# Patient Record
Sex: Female | Born: 1975 | Race: Black or African American | Hispanic: No | Marital: Single | Smoking: Never smoker
Health system: Southern US, Community
[De-identification: ages and names within clinical notes are randomized; demographics above are authoritative.]

## PROBLEM LIST (undated history)

## (undated) DIAGNOSIS — J45909 Unspecified asthma, uncomplicated: Secondary | ICD-10-CM

## (undated) DIAGNOSIS — I10 Essential (primary) hypertension: Secondary | ICD-10-CM

## (undated) DIAGNOSIS — R569 Unspecified convulsions: Secondary | ICD-10-CM

## (undated) HISTORY — PX: TUBAL LIGATION: SHX77

---

## 2009-02-20 ENCOUNTER — Emergency Department (HOSPITAL_BASED_OUTPATIENT_CLINIC_OR_DEPARTMENT_OTHER): Admission: EM | Admit: 2009-02-20 | Discharge: 2009-02-20 | Payer: Self-pay | Admitting: Emergency Medicine

## 2010-10-14 ENCOUNTER — Emergency Department (HOSPITAL_BASED_OUTPATIENT_CLINIC_OR_DEPARTMENT_OTHER)
Admission: EM | Admit: 2010-10-14 | Discharge: 2010-10-14 | Payer: Self-pay | Source: Home / Self Care | Admitting: Emergency Medicine

## 2011-10-09 ENCOUNTER — Emergency Department (HOSPITAL_BASED_OUTPATIENT_CLINIC_OR_DEPARTMENT_OTHER)
Admission: EM | Admit: 2011-10-09 | Discharge: 2011-10-09 | Disposition: A | Discharge status: Home / Self Care | Payer: Self-pay | Attending: Emergency Medicine | Admitting: Emergency Medicine

## 2011-10-09 ENCOUNTER — Encounter: Payer: Self-pay | Admitting: *Deleted

## 2011-10-09 DIAGNOSIS — J329 Chronic sinusitis, unspecified: Secondary | ICD-10-CM | POA: Insufficient documentation

## 2011-10-09 DIAGNOSIS — R51 Headache: Secondary | ICD-10-CM | POA: Insufficient documentation

## 2011-10-09 MED ORDER — AMOXICILLIN 500 MG PO CAPS: 500.0000 mg | ORAL_CAPSULE | Freq: Three times a day (TID) | ORAL | Status: AC

## 2011-10-09 NOTE — ED Notes (Signed)
Pt states she has felt pain over her eyes and head congestion since this a.m. Chills. Denies other s/s.

## 2011-10-09 NOTE — ED Provider Notes (Signed)
History     CSN: [id number] Arrival date & time: 10/09/2011  8:38 PM   First MD Initiated Contact with Patient 10/09/11 2047      Chief Complaint  Patient presents with  . Facial Pain    (Consider location/radiation/quality/duration/timing/severity/associated sxs/prior treatment) Patient is a 35 y.o. female presenting with sinusitis. The history is provided by the patient. No language interpreter was used.  Sinusitis  This is a recurrent problem. The current episode started yesterday. The problem has not changed since onset.There has been no fever. The pain is moderate. The pain has been constant since onset. Associated symptoms include chills, congestion and sinus pressure. She has tried acetaminophen for the symptoms. The treatment provided mild relief.    History reviewed. No pertinent past medical history.  Past Surgical History  Procedure Date  . Cesarean section   . Tubal ligation     History reviewed. No pertinent family history.  History  Substance Use Topics  . Smoking status: Never Smoker   . Smokeless tobacco: Not on file  . Alcohol Use: No    OB History    Grav Para Term Preterm Abortions TAB SAB Ect Mult Living                  Review of Systems  Constitutional: Positive for chills.  HENT: Positive for congestion and sinus pressure.   Eyes: Negative.   Respiratory: Negative.   Cardiovascular: Negative.   Musculoskeletal: Negative.   Neurological: Positive for headaches.    Allergies  Ambien  Home Medications   Current Outpatient Rx  Name Route Sig Dispense Refill  . ACETAMINOPHEN 500 MG PO TABS Oral Take 1,000 mg by mouth every 6 (six) hours as needed. For pain     . PHENYLEPH-CPM-DM-ASPIRIN 7.05-25-09-325 MG PO TBEF Oral Take 1 tablet by mouth once as needed. For allergies     . ALKA-SELTZER PLUS DAY/NIGHT PO Oral Take 2 capsules by mouth once as needed. For cold symptoms        BP 173/93  Pulse 106  Temp(Src) 98.7 F (37.1 C) (Oral)   Resp 18  Ht 5\' 3"  (1.6 m)  Wt 210 lb (95.255 kg)  BMI 37.20 kg/m2  SpO2 100%  LMP 09/25/2011  Physical Exam  Nursing note and vitals reviewed. Constitutional: She is oriented to person, place, and time. She appears well-developed and well-nourished.  HENT:  Head: Normocephalic and atraumatic.  Right Ear: External ear normal.  Left Ear: External ear normal.  Nose: Rhinorrhea present. Right sinus exhibits maxillary sinus tenderness and frontal sinus tenderness.  Mouth/Throat: Oropharynx is clear and moist.  Cardiovascular: Normal rate and regular rhythm.   Pulmonary/Chest: Effort normal and breath sounds normal.  Musculoskeletal: Normal range of motion.  Neurological: She is alert and oriented to person, place, and time.    ED Course  Procedures (including critical care time)  Labs Reviewed - No data to display No results found.   1. Sinusitis       MDM  Will treat pt        Teressa Lower, NP 10/09/11 2102

## 2011-10-12 NOTE — ED Provider Notes (Signed)
Medical screening examination/treatment/procedure(s) were performed by non-physician practitioner and as supervising physician I was immediately available for consultation/collaboration.  Isobelle Tuckett T Ayanna Gheen, MD 10/12/11 1111 

## 2012-01-22 ENCOUNTER — Encounter (HOSPITAL_BASED_OUTPATIENT_CLINIC_OR_DEPARTMENT_OTHER): Payer: Self-pay | Admitting: *Deleted

## 2012-01-22 ENCOUNTER — Emergency Department (HOSPITAL_BASED_OUTPATIENT_CLINIC_OR_DEPARTMENT_OTHER)
Admission: EM | Admit: 2012-01-22 | Discharge: 2012-01-22 | Disposition: A | Discharge status: Home / Self Care | Payer: Self-pay | Attending: Emergency Medicine | Admitting: Emergency Medicine

## 2012-01-22 DIAGNOSIS — R059 Cough, unspecified: Secondary | ICD-10-CM | POA: Insufficient documentation

## 2012-01-22 DIAGNOSIS — R0602 Shortness of breath: Secondary | ICD-10-CM | POA: Insufficient documentation

## 2012-01-22 DIAGNOSIS — L299 Pruritus, unspecified: Secondary | ICD-10-CM | POA: Insufficient documentation

## 2012-01-22 DIAGNOSIS — R05 Cough: Secondary | ICD-10-CM

## 2012-01-22 DIAGNOSIS — R062 Wheezing: Secondary | ICD-10-CM | POA: Insufficient documentation

## 2012-01-22 DIAGNOSIS — J019 Acute sinusitis, unspecified: Secondary | ICD-10-CM | POA: Insufficient documentation

## 2012-01-22 DIAGNOSIS — Z79899 Other long term (current) drug therapy: Secondary | ICD-10-CM | POA: Insufficient documentation

## 2012-01-22 MED ORDER — AMOXICILLIN 500 MG PO CAPS: 500.0000 mg | ORAL_CAPSULE | Freq: Once | ORAL | Status: AC

## 2012-01-22 MED ORDER — ALBUTEROL SULFATE HFA 108 (90 BASE) MCG/ACT IN AERS
2.0000 | INHALATION_SPRAY | Freq: Once | RESPIRATORY_TRACT | Status: AC
  Administered 2012-01-22: 2 via RESPIRATORY_TRACT

## 2012-01-22 MED ORDER — ALBUTEROL SULFATE HFA 108 (90 BASE) MCG/ACT IN AERS: 2.0000 | INHALATION_SPRAY | Freq: Once | RESPIRATORY_TRACT | Status: DC

## 2012-01-22 MED ORDER — ALBUTEROL SULFATE HFA 108 (90 BASE) MCG/ACT IN AERS
2.0000 | INHALATION_SPRAY | Freq: Once | RESPIRATORY_TRACT | Status: DC
  Filled 2012-01-22: qty 6.7

## 2012-01-22 MED ORDER — AMOXICILLIN 500 MG PO CAPS
500.0000 mg | ORAL_CAPSULE | Freq: Once | ORAL | Status: AC
  Administered 2012-01-22: 500 mg via ORAL
  Filled 2012-01-22: qty 1

## 2012-01-22 NOTE — ED Provider Notes (Signed)
Medical screening examination/treatment/procedure(s) were performed by non-physician practitioner and as supervising physician I was immediately available for consultation/collaboration.   Celene Kras, MD 01/22/12 [phone]

## 2012-01-22 NOTE — ED Notes (Signed)
Pt has had cough x 3 days. Took Delsym and broke out with a rash. +itching. Some SHOB with cough. No distress.

## 2012-01-22 NOTE — Discharge Instructions (Signed)
Cough, Adult  A cough is a reflex. It helps you clear your throat and airways. A cough can help heal your body. A cough can last 2 or 3 weeks (acute) or may last more than 8 weeks (chronic). Some common causes of a cough can include an infection, allergy, or a cold. HOME CARE  Only take medicine as told by your doctor.   If given, take your medicines (antibiotics) as told. Finish them even if you start to feel better.   Use a cold steam vaporizer or humidier in your home. This can help loosen thick spit (secretions).   Sleep so you are almost sitting up (semi-upright). Use pillows to do this. This helps reduce coughing.   Rest as needed.   Stop smoking if you smoke.  GET HELP RIGHT AWAY IF:  You have yellowish-white fluid (pus) in your thick spit.   Your cough gets worse.   Your medicine does not reduce coughing, and you are losing sleep.   You cough up blood.   You have trouble breathing.   Your pain gets worse and medicine does not help.   You have a fever.  MAKE SURE YOU:   Understand these instructions.   Will watch your condition.   Will get help right away if you are not doing well or get worse.  Document Released: 06/23/2011 Document Revised: 09/29/2011 Document Reviewed: 06/23/2011 [facility] Patient Information 2012 [place], [place].Drug Rash Skin reactions can be caused by several different drugs. Allergy to the medicine can cause itching, hives, and other rashes. Sun exposure causes a red rash with some medicines. Mononucleosis virus can cause a similar red rash when you are taking antibiotics. Sometimes, the rash may be accompanied by pain. The drug rash may happen with new drugs or with medicines that you have been taking for a while. The rash cannot be spread from person to person. In most cases, the symptoms of a drug rash are gone within a few days of stopping the medicine. Your rash, including hives (urticaria), is most likely from the following  medicines:  Antibiotics or antimicrobials.   Anticonvulsants or seizure medicines.   Antihypertensives or blood pressure medicines.   Antimalarials.   Antidepressants or depression medicines.   Antianxiety drugs.   Diuretics or water pills.   Nonsteroidal anti-inflammatory drugs.   Simvastatin.   Lithium.   Omeprazole.   Allopurinol.   Pseudoephedrine.   Amiodarone.   Packed red blood cells, when you get a blood transfusion.   Contrast media, such as when getting an imaging test (CT or CAT scan).  This drug list is not all inclusive, but drug rashes have been reported with all the medicines listed above.Your caregiver will tell you which medicines to avoid. If you react to a medicine, a similar or worse reaction can occur the next time you take it. If you need to stop taking an antibiotic because of a drug rash, an alternative antibiotic may be needed to get rid of your infection. Antihistamine or cortisone drugs may be prescribed to help relieve your symptoms. Stay out of the sun until the rash is completely gone.  Be sure to let your caregiver know about your drug reaction. Do not take this medicine in the future. Call your caregiver if your drug rash does not improve within 3 to 4 days. SEEK IMMEDIATE MEDICAL CARE IF:   You develop breathing problems, swelling in the throat, or wheezing.   You have weakness, fainting, fever, and muscle or joint  pains.   You develop blisters or peeling of skin, especially around the mouth.  Document Released: 11/17/2004 Document Revised: 09/29/2011 Document Reviewed: 08/28/2008 [facility] Patient Information 2012 [place], [place].

## 2012-01-22 NOTE — ED Provider Notes (Signed)
History     CSN: [id number]  Arrival date & time 01/22/12  1554   First MD Initiated Contact with Patient 01/22/12 1651      Chief Complaint  Patient presents with  . Allergic Reaction    (Consider location/radiation/quality/duration/timing/severity/associated sxs/prior treatment) Patient is a 36 y.o. female presenting with allergic reaction. The history is provided by the patient. No language interpreter was used.  Allergic Reaction The primary symptoms are  wheezing and shortness of breath. Episode onset: 3 days. The problem has not changed since onset.This is a new problem.  The onset of the reaction was associated with a new medication. Significant symptoms also include itching.   Pt complains of a cough for several days.  Pt reports she has asthma but is out of her inhaler.  Pt reports she took delsyn cough medication and now has a rash History reviewed. No pertinent past medical history.  Past Surgical History  Procedure Date  . Cesarean section   . Tubal ligation     History reviewed. No pertinent family history.  History  Substance Use Topics  . Smoking status: Never Smoker   . Smokeless tobacco: Not on file  . Alcohol Use: No    OB History    Grav Para Term Preterm Abortions TAB SAB Ect Mult Living                  Review of Systems  Respiratory: Positive for shortness of breath and wheezing.   Skin: Positive for itching.  All other systems reviewed and are negative.    Allergies  Ambien  Home Medications   Current Outpatient Rx  Name Route Sig Dispense Refill  . CETIRIZINE HCL 10 MG PO TABS Oral Take 10 mg by mouth daily.    Marland Kitchen DEXTROMETHORPHAN POLISTIREX ER 30 MG/5ML PO LQCR Oral Take 75 mg by mouth daily as needed. For cough      BP 191/101  Pulse 108  Temp(Src) 98.1 F (36.7 C) (Oral)  Resp 20  Ht 5\' 3"  (1.6 m)  Wt 200 lb (90.719 kg)  BMI 35.43 kg/m2  SpO2 99%  LMP 01/01/2012  Physical Exam  Nursing note and vitals  reviewed. Constitutional: She is oriented to person, place, and time. She appears well-developed and well-nourished.  HENT:  Head: Normocephalic and atraumatic.  Right Ear: External ear normal.  Left Ear: External ear normal.  Nose: Nose normal.  Mouth/Throat: Oropharynx is clear and moist.  Eyes: Conjunctivae and EOM are normal. Pupils are equal, round, and reactive to light.  Neck: Normal range of motion. Neck supple.  Cardiovascular: Normal rate and normal heart sounds.   Pulmonary/Chest: Effort normal and breath sounds normal.  Abdominal: Soft.  Musculoskeletal: Normal range of motion.  Neurological: She is alert and oriented to person, place, and time.  Skin: Skin is warm.  Psychiatric: She has a normal mood and affect.    ED Course  Procedures (including critical care time)  Labs Reviewed - No data to display No results found.   No diagnosis found.    MDM  Pt given rx for amox and an albuterol inhaler.         Lonia Skinner [place], [place] 01/22/12 [phone]

## 2013-09-05 ENCOUNTER — Emergency Department (HOSPITAL_BASED_OUTPATIENT_CLINIC_OR_DEPARTMENT_OTHER)
Admission: EM | Admit: 2013-09-05 | Discharge: 2013-09-05 | Disposition: A | Discharge status: Home / Self Care | Payer: Medicaid Other | Attending: Emergency Medicine | Admitting: Emergency Medicine

## 2013-09-05 ENCOUNTER — Encounter (HOSPITAL_BASED_OUTPATIENT_CLINIC_OR_DEPARTMENT_OTHER): Payer: Self-pay | Admitting: Emergency Medicine

## 2013-09-05 ENCOUNTER — Emergency Department (HOSPITAL_BASED_OUTPATIENT_CLINIC_OR_DEPARTMENT_OTHER): Payer: Medicaid Other

## 2013-09-05 DIAGNOSIS — I10 Essential (primary) hypertension: Secondary | ICD-10-CM | POA: Insufficient documentation

## 2013-09-05 DIAGNOSIS — J45909 Unspecified asthma, uncomplicated: Secondary | ICD-10-CM | POA: Insufficient documentation

## 2013-09-05 DIAGNOSIS — J069 Acute upper respiratory infection, unspecified: Secondary | ICD-10-CM | POA: Insufficient documentation

## 2013-09-05 DIAGNOSIS — Z79899 Other long term (current) drug therapy: Secondary | ICD-10-CM | POA: Insufficient documentation

## 2013-09-05 HISTORY — DX: Unspecified asthma, uncomplicated: J45.909

## 2013-09-05 HISTORY — DX: Essential (primary) hypertension: I10

## 2013-09-05 MED ORDER — ALBUTEROL SULFATE HFA 108 (90 BASE) MCG/ACT IN AERS: 1.0000 | INHALATION_SPRAY | Freq: Four times a day (QID) | RESPIRATORY_TRACT | Status: DC | PRN

## 2013-09-05 MED ORDER — BENZONATATE 100 MG PO CAPS: 100.0000 mg | ORAL_CAPSULE | Freq: Three times a day (TID) | ORAL | Status: DC

## 2013-09-05 NOTE — ED Notes (Signed)
Onalee Hua, NP in the at the bedside.

## 2013-09-05 NOTE — ED Provider Notes (Signed)
CSN: [id number]     Arrival date & time 09/05/13  1222 History   First MD Initiated Contact with Patient 09/05/13 1237     Chief Complaint  Patient presents with  . Cough   (Consider location/radiation/quality/duration/timing/severity/associated sxs/prior Treatment) Patient is a 37 y.o. female presenting with cough. The history is provided by the patient. No language interpreter was used.  Cough Cough characteristics:  Productive, paroxysmal and hacking Sputum characteristics:  Yellow Severity:  Moderate Onset quality:  Gradual Timing:  Intermittent Progression:  Worsening Chronicity:  New Smoker: no   Context: sick contacts   Relieved by:  None tried Ineffective treatments:  None tried Associated symptoms: fever, headaches and sinus congestion     Past Medical History  Diagnosis Date  . Asthma   . Hypertension    Past Surgical History  Procedure Laterality Date  . Cesarean section    . Tubal ligation     History reviewed. No pertinent family history. History  Substance Use Topics  . Smoking status: Never Smoker   . Smokeless tobacco: Not on file  . Alcohol Use: No   OB History   Grav Para Term Preterm Abortions TAB SAB Ect Mult Living                 Review of Systems  Constitutional: Positive for fever.  Respiratory: Positive for cough.   Neurological: Positive for headaches.  All other systems reviewed and are negative.    Allergies  Zolpidem tartrate  Home Medications   Current Outpatient Rx  Name  Route  Sig  Dispense  Refill  . hydrochlorothiazide (HYDRODIURIL) 12.5 MG tablet   Oral   Take 12.5 mg by mouth daily.         Marland Kitchen EXPIRED: albuterol (PROVENTIL HFA;VENTOLIN HFA) 108 (90 BASE) MCG/ACT inhaler   Inhalation   Inhale 2 puffs into the lungs once.   1 Inhaler   0   . cetirizine (ZYRTEC) 10 MG tablet   Oral   Take 10 mg by mouth daily.         Marland Kitchen dextromethorphan (DELSYM) 30 MG/5ML liquid   Oral   Take 75 mg by mouth daily as  needed. For cough          BP 146/89  Pulse 110  Temp(Src) 98.2 F (36.8 C) (Oral)  Resp 22  Ht 5\' 3"  (1.6 m)  Wt 215 lb (97.523 kg)  BMI 38.09 kg/m2  SpO2 99%  LMP 08/18/2013 Physical Exam  Nursing note and vitals reviewed. Constitutional: She is oriented to person, place, and time. She appears well-developed and well-nourished.  HENT:  Head: Normocephalic.  Right Ear: Tympanic membrane normal.  Left Ear: Tympanic membrane normal.  Mouth/Throat: No oropharyngeal exudate.  Eyes: Pupils are equal, round, and reactive to light.  Neck: Neck supple.  Cardiovascular: Normal rate and regular rhythm.   Pulmonary/Chest: Effort normal.   She exhibits tenderness.  Abdominal: Soft.  Musculoskeletal: She exhibits no edema and no tenderness.  Lymphadenopathy:    She has no cervical adenopathy.  Neurological: She is alert and oriented to person, place, and time.  Skin: Skin is warm and dry.  Psychiatric: She has a normal mood and affect. Her behavior is normal. Judgment and thought content normal.    ED Course  Procedures (including critical care time) Labs Review Labs Reviewed - No data to display Imaging Review No results found.  EKG Interpretation   None      Radiology results reviewed and shared  with patient. MDM   Viral URI with cough.    Jimmye Norman, NP 09/05/13 1355

## 2013-09-05 NOTE — ED Notes (Signed)
Pt states she started having a cough since Monday but also states she has asthma and is not sure which one is worse. States she has been wheezing more. Also reports nasal congestion. Denies sore throat.

## 2013-09-06 NOTE — ED Provider Notes (Signed)
Medical screening examination/treatment/procedure(s) were performed by non-physician practitioner and as supervising physician I was immediately available for consultation/collaboration.     Geoffery Lyons, MD 09/06/13 [phone]

## 2014-01-15 ENCOUNTER — Encounter (HOSPITAL_BASED_OUTPATIENT_CLINIC_OR_DEPARTMENT_OTHER): Payer: Self-pay | Admitting: Emergency Medicine

## 2014-01-15 ENCOUNTER — Emergency Department (HOSPITAL_BASED_OUTPATIENT_CLINIC_OR_DEPARTMENT_OTHER)
Admission: EM | Admit: 2014-01-15 | Discharge: 2014-01-15 | Disposition: A | Discharge status: Home / Self Care | Payer: Medicaid Other | Attending: Emergency Medicine | Admitting: Emergency Medicine

## 2014-01-15 DIAGNOSIS — J45909 Unspecified asthma, uncomplicated: Secondary | ICD-10-CM | POA: Insufficient documentation

## 2014-01-15 DIAGNOSIS — R0981 Nasal congestion: Secondary | ICD-10-CM

## 2014-01-15 DIAGNOSIS — I10 Essential (primary) hypertension: Secondary | ICD-10-CM | POA: Insufficient documentation

## 2014-01-15 DIAGNOSIS — Z79899 Other long term (current) drug therapy: Secondary | ICD-10-CM | POA: Insufficient documentation

## 2014-01-15 DIAGNOSIS — J029 Acute pharyngitis, unspecified: Secondary | ICD-10-CM | POA: Insufficient documentation

## 2014-01-15 LAB — RAPID STREP SCREEN (MED CTR MEBANE ONLY): STREPTOCOCCUS, GROUP A SCREEN (DIRECT): NEGATIVE

## 2014-01-15 MED ORDER — PSEUDOEPHEDRINE HCL ER 120 MG PO TB12: 120.0000 mg | ORAL_TABLET | Freq: Two times a day (BID) | ORAL | Status: DC

## 2014-01-15 MED ORDER — DEXAMETHASONE 1 MG/ML PO CONC
10.0000 mg | Freq: Once | ORAL | Status: AC
  Administered 2014-01-15: 10 mg via ORAL
  Filled 2014-01-15: qty 10

## 2014-01-15 MED ORDER — KETOROLAC TROMETHAMINE 30 MG/ML IJ SOLN
30.0000 mg | Freq: Once | INTRAMUSCULAR | Status: AC
  Administered 2014-01-15: 30 mg via INTRAMUSCULAR
  Filled 2014-01-15: qty 1

## 2014-01-15 MED ORDER — IBUPROFEN 600 MG PO TABS: 600.0000 mg | ORAL_TABLET | Freq: Three times a day (TID) | ORAL | Status: AC

## 2014-01-15 MED ORDER — LIDOCAINE VISCOUS 2 % MT SOLN: 20.0000 mL | OROMUCOSAL | Status: DC | PRN

## 2014-01-15 NOTE — ED Provider Notes (Signed)
CSN: [id number]     Arrival date & time 01/15/14  1827 History  This chart was scribed for Gerhard Munch, MD by Ellin Mayhew, ED Scribe. This patient was seen in room MH04/MH04 and the patient's care was started at 6:35 PM.  The history is provided by the patient. No language interpreter was used.   HPI Comments: Ellen Marks is 38 y.o. female who presents to the Emergency Department with a chief complaint of sore throat with minimal swelling, congestion and sinus pressure; onset 2 days. Patient also reports L ear pain with onset 2 days. She characterizes her pain in her throat as a soreness/iritation and rates it as moderate discomfort. Patient reports taking throat spray, gargling peroxide, and Motrin without relief and constant, progressively worsening symptoms. She denies any cough, wheezes, trouble swallowing, or SOB. Patient reports a history of streptococcal infection 5 years ago. She confirms having asthma currently and uses an albuterol inhaler. She reports symptoms reducing in intensity while eating. She reports her LNMP was one month ago. Patient confirms taking hydrochlorothiazide for HTN w/o complication.   Past Medical History  Diagnosis Date  . Asthma   . Hypertension    Past Surgical History  Procedure Laterality Date  . Cesarean section    . Tubal ligation     No family history on file. History  Substance Use Topics  . Smoking status: Never Smoker   . Smokeless tobacco: Not on file  . Alcohol Use: No   OB History   Grav Para Term Preterm Abortions TAB SAB Ect Mult Living                 Review of Systems  Constitutional:       Per HPI, otherwise negative  HENT: Positive for congestion, ear pain, sinus pressure and sore throat. Negative for trouble swallowing.        Per HPI, otherwise negative  Respiratory: Negative for cough and shortness of breath.        Per HPI, otherwise negative  Cardiovascular:       Per HPI, otherwise negative  Gastrointestinal:  Negative for vomiting.  Endocrine:       Negative aside from HPI  Genitourinary:       Neg aside from HPI   Musculoskeletal:       Per HPI, otherwise negative  Skin: Negative.   Neurological: Negative for syncope.   A complete 10 system review of systems was obtained and all systems are negative except as noted in the HPI and PMH.   Allergies  Zolpidem tartrate  Home Medications   Current Outpatient Rx  Name  Route  Sig  Dispense  Refill  . EXPIRED: albuterol (PROVENTIL HFA;VENTOLIN HFA) 108 (90 BASE) MCG/ACT inhaler   Inhalation   Inhale 2 puffs into the lungs once.   1 Inhaler   0   . albuterol (PROVENTIL HFA;VENTOLIN HFA) 108 (90 BASE) MCG/ACT inhaler   Inhalation   Inhale 1-2 puffs into the lungs every 6 (six) hours as needed for wheezing or shortness of breath.   1 Inhaler   0   . benzonatate (TESSALON) 100 MG capsule   Oral   Take 1 capsule (100 mg total) by mouth every 8 (eight) hours.   21 capsule   0   . cetirizine (ZYRTEC) 10 MG tablet   Oral   Take 10 mg by mouth daily.         Marland Kitchen dextromethorphan (DELSYM) 30 MG/5ML liquid   Oral  Take 75 mg by mouth daily as needed. For cough         . hydrochlorothiazide (HYDRODIURIL) 12.5 MG tablet   Oral   Take 12.5 mg by mouth daily.          Triage Vitals: BP 162/96  Pulse 120  Temp(Src) 98.5 F (36.9 C) (Oral)  Resp 18  SpO2 100%  LMP 12/17/2013  Physical Exam  Nursing note and vitals reviewed. Constitutional: She is oriented to person, place, and time. She appears well-developed and well-nourished. No distress.  HENT:  Head: Normocephalic and atraumatic.  Right Ear: Tympanic membrane is not injected.  Left Ear: Tympanic membrane is not injected.  Mouth/Throat: Posterior oropharyngeal erythema present. No oropharyngeal exudate.  Fullness noted in tonsils. Fluid behind L ear.   Eyes: Conjunctivae and EOM are normal.  Neck:  Fullness in sub-mandibular area. No adenopathy noted.   Cardiovascular: Normal rate, regular rhythm and normal heart sounds.   No murmur heard. Pulmonary/Chest: Effort normal and breath sounds normal. No stridor. No respiratory distress.  Abdominal: She exhibits no distension.  Musculoskeletal: She exhibits no edema.  Neurological: She is alert and oriented to person, place, and time. No cranial nerve deficit.  Skin: Skin is warm and dry.  Psychiatric: She has a normal mood and affect.    ED Course  Procedures (including critical care time)  DIAGNOSTIC STUDIES: Oxygen Saturation is 100% on room air, normal by my interpretation.    COORDINATION OF CARE: 6:41 PM-Discussed my suspicion of a streptococcal infection. Will screen for strep throat and F/U with patient following results. Treatment plan discussed with patient and patient agrees.  Labs Review Labs Reviewed  RAPID STREP SCREEN  CULTURE, GROUP A STREP   7:49 PM On exam the patient is in no distress, speaking easily with colleagues, smiling. She notes that her pain is improved, her swelling decreased and she has no new complaints. We discussed return precautions, follow up instructions  MDM   Final diagnoses:  Sore throat  Sinus congestion     I personally performed the services described in this documentation, which was scribed in my presence. The recorded information has been reviewed and is accurate.  Female presents with sore throat, mild swelling of the left side of her neck.  Patient is in no respiratory distress, hemodynamically stable, afebrile.  Patient proved here with anti-inflammatories, steroids. With no asymmetry of her oropharynx, no evidence of distress, and buttocks are not indicated.  However, with her middle ear effusion, there is some suspicion for inflammatory condition possibly sinusitis.  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  She was discharged in stable condition to follow up after we discussed return precautions, follow up  instructions and initiated therapy.    Gerhard Munchobert Jamesetta Greenhalgh, MD 01/15/14 1950

## 2014-01-15 NOTE — Discharge Instructions (Signed)
As discussed, your evaluation today has been largely reassuring.  But, it is important that you monitor your condition carefully, and do not hesitate to return to the ED if you develop new, or concerning changes in your condition. ? ?Otherwise, please follow-up with your physician for appropriate ongoing care. ? ?

## 2014-01-15 NOTE — ED Notes (Signed)
Pt c/o nasal drainage, left side of throat sore and feels "swollen". Pt also c/o left ear pain.

## 2014-01-17 LAB — CULTURE, GROUP A STREP

## 2014-09-04 ENCOUNTER — Emergency Department (HOSPITAL_BASED_OUTPATIENT_CLINIC_OR_DEPARTMENT_OTHER)
Admission: EM | Admit: 2014-09-04 | Discharge: 2014-09-04 | Disposition: A | Discharge status: Home / Self Care | Payer: Medicaid Other | Attending: Emergency Medicine | Admitting: Emergency Medicine

## 2014-09-04 ENCOUNTER — Encounter (HOSPITAL_BASED_OUTPATIENT_CLINIC_OR_DEPARTMENT_OTHER): Payer: Self-pay | Admitting: *Deleted

## 2014-09-04 DIAGNOSIS — Z7951 Long term (current) use of inhaled steroids: Secondary | ICD-10-CM | POA: Insufficient documentation

## 2014-09-04 DIAGNOSIS — Z79899 Other long term (current) drug therapy: Secondary | ICD-10-CM | POA: Insufficient documentation

## 2014-09-04 DIAGNOSIS — I10 Essential (primary) hypertension: Secondary | ICD-10-CM | POA: Insufficient documentation

## 2014-09-04 DIAGNOSIS — J0101 Acute recurrent maxillary sinusitis: Secondary | ICD-10-CM

## 2014-09-04 DIAGNOSIS — J45909 Unspecified asthma, uncomplicated: Secondary | ICD-10-CM | POA: Insufficient documentation

## 2014-09-04 MED ORDER — FLUTICASONE PROPIONATE 50 MCG/ACT NA SUSP: 1.0000 | Freq: Every day | NASAL | Status: DC

## 2014-09-04 MED ORDER — HYDROCODONE-ACETAMINOPHEN 5-325 MG PO TABS
1.0000 | ORAL_TABLET | ORAL | Status: AC
  Administered 2014-09-04: 1 via ORAL
  Filled 2014-09-04: qty 1

## 2014-09-04 MED ORDER — HYDROCODONE-ACETAMINOPHEN 5-325 MG PO TABS
1.0000 | ORAL_TABLET | ORAL | Status: DC | PRN
Start: 2014-09-04 — End: 2015-12-09

## 2014-09-04 MED ORDER — HYDROCHLOROTHIAZIDE 25 MG PO TABS
25.0000 mg | ORAL_TABLET | Freq: Once | ORAL | Status: AC
  Administered 2014-09-04: 25 mg via ORAL
  Filled 2014-09-04: qty 1

## 2014-09-04 MED ORDER — HYDROCHLOROTHIAZIDE 25 MG PO TABS: 25.0000 mg | ORAL_TABLET | Freq: Every day | ORAL | Status: DC

## 2014-09-04 NOTE — ED Provider Notes (Signed)
CSN: [id number]     Arrival date & time 09/04/14  1923 History   First MD Initiated Contact with Patient 09/04/14 2203     This chart was scribed for Linwood DibblesJon Haydon Kalmar, MD by Arlan OrganAshley Leger, ED Scribe. This patient was seen in room MH12/MH12 and the patient's care was started 10:20 PM.   Chief Complaint  Patient presents with  . Facial Pain    Patient is a 38 y.o. female presenting with general illness. The history is provided by the patient. No language interpreter was used.  Illness Location:  R sided maxillary tenderness Severity:  Moderate Onset quality:  Gradual Duration:  1 week Timing:  Constant Progression:  Unchanged Chronicity:  Recurrent Relieved by:  Nothing Worsened by:  Blowing nose Associated symptoms: congestion   Associated symptoms: no fever   Congestion:    Location:  Nasal   HPI Comments: Ellen Marks is a 38 y.o. female with a PMHx of asthma and HTN who presents to the Emergency Department complaining of constant, moderate R sided facial pain x 1 week that is unchanged at this time. Pt attributes discomfort to sinus pressure and congestion. She has tried OTC medications without any improvement for symptoms. No recent fever, nasal drainage, or chills. No numbness or paresthesia to lower extremities. Pt has been somewhat non complaint with blood pressure medication, HCTZ 25 mg due to cancellation of medical insurance. No known allergies to medications. No other concerns this visit.   Past Medical History  Diagnosis Date  . Asthma   . Hypertension    Past Surgical History  Procedure Laterality Date  . Cesarean section    . Tubal ligation     History reviewed. No pertinent family history. History  Substance Use Topics  . Smoking status: Never Smoker   . Smokeless tobacco: Not on file  . Alcohol Use: No   OB History    No data available     Review of Systems  Constitutional: Negative for fever and chills.  HENT: Positive for congestion, sinus pressure and  sneezing.   All other systems reviewed and are negative.     Allergies  Zolpidem tartrate  Home Medications   Prior to Admission medications   Medication Sig Start Date End Date Taking? Authorizing Provider  albuterol (PROVENTIL HFA;VENTOLIN HFA) 108 (90 BASE) MCG/ACT inhaler Inhale 2 puffs into the lungs once. 01/22/12 01/21/13  Elson AreasLeslie K Sofia, PA-C  albuterol (PROVENTIL HFA;VENTOLIN HFA) 108 (90 BASE) MCG/ACT inhaler Inhale 1-2 puffs into the lungs every 6 (six) hours as needed for wheezing or shortness of breath. 09/05/13   Jimmye Normanavid John Smith, NP  benzonatate (TESSALON) 100 MG capsule Take 1 capsule (100 mg total) by mouth every 8 (eight) hours. 09/05/13   Jimmye Normanavid John Smith, NP  cetirizine (ZYRTEC) 10 MG tablet Take 10 mg by mouth daily.    Historical Provider, MD  dextromethorphan (DELSYM) 30 MG/5ML liquid Take 75 mg by mouth daily as needed. For cough    Historical Provider, MD  fluticasone (FLONASE) 50 MCG/ACT nasal spray Place 1 spray into both nostrils daily. 09/04/14   Linwood DibblesJon Elowen Debruyn, MD  hydrochlorothiazide (HYDRODIURIL) 25 MG tablet Take 1 tablet (25 mg total) by mouth daily. 09/04/14   Linwood DibblesJon Dajane Valli, MD  HYDROcodone-acetaminophen (NORCO/VICODIN) 5-325 MG per tablet Take 1-2 tablets by mouth every 4 (four) hours as needed. 09/04/14   Linwood DibblesJon Dugan Vanhoesen, MD  lidocaine (XYLOCAINE) 2 % solution Use as directed 20 mLs in the mouth or throat every 4 (four) hours as  needed for mouth pain. 01/15/14   Gerhard Munchobert Lockwood, MD  pseudoephedrine (SUDAFED 12 HOUR) 120 MG 12 hr tablet Take 1 tablet (120 mg total) by mouth 2 (two) times daily. 01/15/14   Gerhard Munchobert Lockwood, MD   Triage Vitals: BP 189/109 mmHg  Pulse 96  Temp(Src) 98.9 F (37.2 C) (Oral)  Resp 20  Ht 5\' 3"  (1.6 m)  Wt 207 lb (93.895 kg)  BMI 36.68 kg/m2  SpO2 98%  LMP 08/13/2014   Physical Exam  Constitutional: She appears well-developed and well-nourished. No distress.  HENT:  Head: Normocephalic and atraumatic.  Right Ear: External ear normal.   Left Ear: External ear normal.  R sided maxillary facial tenderness  No nasal purulent drainage  Eyes: Conjunctivae are normal. Right eye exhibits no discharge. Left eye exhibits no discharge. No scleral icterus.  Neck: Neck supple. No tracheal deviation present.  Cardiovascular: Normal rate, regular rhythm and intact distal pulses.   Pulmonary/Chest: Effort normal and breath sounds normal. No stridor. No respiratory distress. She has no wheezes. She has no rales.  Abdominal: Soft. Bowel sounds are normal. She exhibits no distension. There is no tenderness. There is no rebound and no guarding.  Musculoskeletal: She exhibits no edema or tenderness.  Neurological: She is alert. She has normal strength. No cranial nerve deficit (no facial droop, extraocular movements intact, no slurred speech) or sensory deficit. She exhibits normal muscle tone. She displays no seizure activity. Coordination normal.  Skin: Skin is warm and dry. No rash noted.  Psychiatric: She has a normal mood and affect.  Nursing note and vitals reviewed.   ED Course  Procedures (including critical care time)  DIAGNOSTIC STUDIES: Oxygen Saturation is 98% on RA, Normal by my interpretation.    COORDINATION OF CARE: 10:20 PM-Discussed treatment plan with pt at bedside and pt agreed to plan.     Labs Review Labs Reviewed - No data to display  Imaging Review No results found.   EKG Interpretation None      MDM   Final diagnoses:  Acute recurrent maxillary sinusitis  Essential hypertension   Pt is having sinus tenderness but no purulent drainage on exam.  No fever.  Doubt bacterial infection.  She also has htn but has not been taking her medications due to insurance reasons.  Will give pain medications, nasal steroids, refill her bp meds.  At this time there does not appear to be any evidence of an acute emergency medical condition and the patient appears stable for discharge with appropriate outpatient follow  up.   I personally performed the services described in this documentation, which was scribed in my presence. The recorded information has been reviewed and is accurate.    Linwood DibblesJon Bjorn Hallas, MD 09/04/14 2222

## 2014-09-04 NOTE — Discharge Instructions (Signed)
Headaches, Frequently Asked Questions °MIGRAINE HEADACHES °Q: What is migraine? What causes it? How can I treat it? °A: Generally, migraine headaches begin as a dull ache. Then they develop into a constant, throbbing, and pulsating pain. You may experience pain at the temples. You may experience pain at the front or back of one or both sides of the head. The pain is usually accompanied by a combination of: °· Nausea. °· Vomiting. °· Sensitivity to light and noise. °Some people (about 15%) experience an aura (see below) before an attack. The cause of migraine is believed to be chemical reactions in the brain. Treatment for migraine may include over-the-counter or prescription medications. It may also include self-help techniques. These include relaxation training and biofeedback.  °Q: What is an aura? °A: About 15% of people with migraine get an "aura". This is a sign of neurological symptoms that occur before a migraine headache. You may see wavy or jagged lines, dots, or flashing lights. You might experience tunnel vision or blind spots in one or both eyes. The aura can include visual or auditory hallucinations (something imagined). It may include disruptions in smell (such as strange odors), taste or touch. Other symptoms include: °· Numbness. °· A "pins and needles" sensation. °· Difficulty in recalling or speaking the correct word. °These neurological events may last as long as 60 minutes. These symptoms will fade as the headache begins. °Q: What is a trigger? °A: Certain physical or environmental factors can lead to or "trigger" a migraine. These include: °· Foods. °· Hormonal changes. °· Weather. °· Stress. °It is important to remember that triggers are different for everyone. To help prevent migraine attacks, you need to figure out which triggers affect you. Keep a headache diary. This is a good way to track triggers. The diary will help you talk to your healthcare professional about your condition. °Q: Does  weather affect migraines? °A: Bright sunshine, hot, humid conditions, and drastic changes in barometric pressure may lead to, or "trigger," a migraine attack in some people. But studies have shown that weather does not act as a trigger for everyone with migraines. °Q: What is the link between migraine and hormones? °A: Hormones start and regulate many of your body's functions. Hormones keep your body in balance within a constantly changing environment. The levels of hormones in your body are unbalanced at times. Examples are during menstruation, pregnancy, or menopause. That can lead to a migraine attack. In fact, about three quarters of all women with migraine report that their attacks are related to the menstrual cycle.  °Q: Is there an increased risk of stroke for migraine sufferers? °A: The likelihood of a migraine attack causing a stroke is very remote. That is not to say that migraine sufferers cannot have a stroke associated with their migraines. In persons under age 40, the most common associated factor for stroke is migraine headache. But over the course of a person's normal life span, the occurrence of migraine headache may actually be associated with a reduced risk of dying from cerebrovascular disease due to stroke.  °Q: What are acute medications for migraine? °A: Acute medications are used to treat the pain of the headache after it has started. Examples over-the-counter medications, NSAIDs, ergots, and triptans.  °Q: What are the triptans? °A: Triptans are the newest class of abortive medications. They are specifically targeted to treat migraine. Triptans are vasoconstrictors. They moderate some chemical reactions in the brain. The triptans work on receptors in your brain. Triptans help   to restore the balance of a neurotransmitter called serotonin. Fluctuations in levels of serotonin are thought to be a main cause of migraine.  °Q: Are over-the-counter medications for migraine effective? °A:  Over-the-counter, or "OTC," medications may be effective in relieving mild to moderate pain and associated symptoms of migraine. But you should see your caregiver before beginning any treatment regimen for migraine.  °Q: What are preventive medications for migraine? °A: Preventive medications for migraine are sometimes referred to as "prophylactic" treatments. They are used to reduce the frequency, severity, and length of migraine attacks. Examples of preventive medications include antiepileptic medications, antidepressants, beta-blockers, calcium channel blockers, and NSAIDs (nonsteroidal anti-inflammatory drugs). °Q: Why are anticonvulsants used to treat migraine? °A: During the past few years, there has been an increased interest in antiepileptic drugs for the prevention of migraine. They are sometimes referred to as "anticonvulsants". Both epilepsy and migraine may be caused by similar reactions in the brain.  °Q: Why are antidepressants used to treat migraine? °A: Antidepressants are typically used to treat people with depression. They may reduce migraine frequency by regulating chemical levels, such as serotonin, in the brain.  °Q: What alternative therapies are used to treat migraine? °A: The term "alternative therapies" is often used to describe treatments considered outside the scope of conventional Western medicine. Examples of alternative therapy include acupuncture, acupressure, and yoga. Another common alternative treatment is herbal therapy. Some herbs are believed to relieve headache pain. Always discuss alternative therapies with your caregiver before proceeding. Some herbal products contain arsenic and other toxins. °TENSION HEADACHES °Q: What is a tension-type headache? What causes it? How can I treat it? °A: Tension-type headaches occur randomly. They are often the result of temporary stress, anxiety, fatigue, or anger. Symptoms include soreness in your temples, a tightening band-like sensation  around your head (a "vice-like" ache). Symptoms can also include a pulling feeling, pressure sensations, and contracting head and neck muscles. The headache begins in your forehead, temples, or the back of your head and neck. Treatment for tension-type headache may include over-the-counter or prescription medications. Treatment may also include self-help techniques such as relaxation training and biofeedback. °CLUSTER HEADACHES °Q: What is a cluster headache? What causes it? How can I treat it? °A: Cluster headache gets its name because the attacks come in groups. The pain arrives with little, if any, warning. It is usually on one side of the head. A tearing or bloodshot eye and a runny nose on the same side of the headache may also accompany the pain. Cluster headaches are believed to be caused by chemical reactions in the brain. They have been described as the most severe and intense of any headache type. Treatment for cluster headache includes prescription medication and oxygen. °SINUS HEADACHES °Q: What is a sinus headache? What causes it? How can I treat it? °A: When a cavity in the bones of the face and skull (a sinus) becomes inflamed, the inflammation will cause localized pain. This condition is usually the result of an allergic reaction, a tumor, or an infection. If your headache is caused by a sinus blockage, such as an infection, you will probably have a fever. An x-ray will confirm a sinus blockage. Your caregiver's treatment might include antibiotics for the infection, as well as antihistamines or decongestants.  °REBOUND HEADACHES °Q: What is a rebound headache? What causes it? How can I treat it? °A: A pattern of taking acute headache medications too often can lead to a condition known as "rebound headache."   A pattern of taking too much headache medication includes taking it more than 2 days per week or in excessive amounts. That means more than the label or a caregiver advises. With rebound  headaches, your medications not only stop relieving pain, they actually begin to cause headaches. Doctors treat rebound headache by tapering the medication that is being overused. Sometimes your caregiver will gradually substitute a different type of treatment or medication. Stopping may be a challenge. Regularly overusing a medication increases the potential for serious side effects. Consult a caregiver if you regularly use headache medications more than 2 days per week or more than the label advises. ADDITIONAL QUESTIONS AND ANSWERS Q: What is biofeedback? A: Biofeedback is a self-help treatment. Biofeedback uses special equipment to monitor your body's involuntary physical responses. Biofeedback monitors:  Breathing.  Pulse.  Heart rate.  Temperature.  Muscle tension.  Brain activity. Biofeedback helps you refine and perfect your relaxation exercises. You learn to control the physical responses that are related to stress. Once the technique has been mastered, you do not need the equipment any more. Q: Are headaches hereditary? A: Four out of five (80%) of people that suffer report a family history of migraine. Scientists are not sure if this is genetic or a family predisposition. Despite the uncertainty, a child has a 50% chance of having migraine if one parent suffers. The child has a 75% chance if both parents suffer.  Q: Can children get headaches? A: By the time they reach high school, most young people have experienced some type of headache. Many safe and effective approaches or medications can prevent a headache from occurring or stop it after it has begun.  Q: What type of doctor should I see to diagnose and treat my headache? A: Start with your primary caregiver. Discuss his or her experience and approach to headaches. Discuss methods of classification, diagnosis, and treatment. Your caregiver may decide to recommend you to a headache specialist, depending upon your symptoms or other  physical conditions. Having diabetes, allergies, etc., may require a more comprehensive and inclusive approach to your headache. The National Head[employer] Foundation will provide, upon request, a list of [facility] physician members in your state. Document Released: 12/31/2003 Document Revised: 01/02/2012 Document Reviewed: 06/09/2008 [facility] Patient Information 2015 [place], [place]. This information is not intended to replace advice given to you by your health care provider. Make sure you discuss any questions you have with your health care provider.  Hypertension Hypertension is another name for high blood pressure. High blood pressure forces your heart to work harder to pump blood. A blood pressure reading has two numbers, which includes a higher number over a lower number (example: 110/72). HOME CARE   Have your blood pressure rechecked by your doctor.  Only take medicine as told by your doctor. Follow the directions carefully. The medicine does not work as well if you skip doses. Skipping doses also puts you at risk for problems.  Do not smoke.  Monitor your blood pressure at home as told by your doctor. GET HELP IF:  You think you are having a reaction to the medicine you are taking.  You have repeat headaches or feel dizzy.  You have puffiness (swelling) in your ankles.  You have trouble with your vision. GET HELP RIGHT AWAY IF:   You get a very bad headache and are confused.  You feel weak, numb, or faint.  You get chest or belly (abdominal) pain.  You throw up (vomit).  You  cannot breathe very well. MAKE SURE YOU:   Understand these instructions.  Will watch your condition.  Will get help right away if you are not doing well or get worse. Document Released: 03/28/2008 Document Revised: 10/15/2013 Document Reviewed: 08/02/2013 [facility]elandExitCare Patient Information 2015 [place], [place]. This information is not intended to replace advice given to you by your health care provider. Make  sure you discuss any questions you have with your health care provider.

## 2014-09-04 NOTE — ED Notes (Signed)
Pt c/o sinus congestion and facial pain , pt also requesting HCTZ 25mg  prescription

## 2015-01-22 ENCOUNTER — Encounter (HOSPITAL_BASED_OUTPATIENT_CLINIC_OR_DEPARTMENT_OTHER): Payer: Self-pay | Admitting: Emergency Medicine

## 2015-01-22 ENCOUNTER — Emergency Department (HOSPITAL_BASED_OUTPATIENT_CLINIC_OR_DEPARTMENT_OTHER)
Admission: EM | Admit: 2015-01-22 | Discharge: 2015-01-23 | Disposition: A | Discharge status: Home / Self Care | Payer: Medicaid Other | Attending: Emergency Medicine | Admitting: Emergency Medicine

## 2015-01-22 DIAGNOSIS — R519 Headache, unspecified: Secondary | ICD-10-CM

## 2015-01-22 DIAGNOSIS — I10 Essential (primary) hypertension: Secondary | ICD-10-CM | POA: Insufficient documentation

## 2015-01-22 DIAGNOSIS — Z79899 Other long term (current) drug therapy: Secondary | ICD-10-CM | POA: Insufficient documentation

## 2015-01-22 DIAGNOSIS — R42 Dizziness and giddiness: Secondary | ICD-10-CM | POA: Insufficient documentation

## 2015-01-22 DIAGNOSIS — J45909 Unspecified asthma, uncomplicated: Secondary | ICD-10-CM | POA: Insufficient documentation

## 2015-01-22 DIAGNOSIS — Z7951 Long term (current) use of inhaled steroids: Secondary | ICD-10-CM | POA: Insufficient documentation

## 2015-01-22 DIAGNOSIS — R51 Headache: Secondary | ICD-10-CM

## 2015-01-22 NOTE — ED Notes (Signed)
C/o pressure to rt side of face and head onset yesterday  Denies congestion, runny nose, or cough

## 2015-01-22 NOTE — ED Notes (Signed)
Patient reports headache and pressure to left side of face and head since yesterday. The patient reports some dizziness

## 2015-01-23 MED ORDER — METOCLOPRAMIDE HCL 10 MG PO TABS
10.0000 mg | ORAL_TABLET | Freq: Once | ORAL | Status: AC
  Administered 2015-01-23: 10 mg via ORAL
  Filled 2015-01-23: qty 1

## 2015-01-23 MED ORDER — NAPROXEN 250 MG PO TABS
500.0000 mg | ORAL_TABLET | Freq: Once | ORAL | Status: AC
  Administered 2015-01-23: 500 mg via ORAL
  Filled 2015-01-23: qty 2

## 2015-01-23 MED ORDER — HYDROCHLOROTHIAZIDE 25 MG PO TABS
25.0000 mg | ORAL_TABLET | Freq: Once | ORAL | Status: AC
  Administered 2015-01-23: 25 mg via ORAL
  Filled 2015-01-23: qty 1

## 2015-01-23 MED ORDER — DIPHENHYDRAMINE HCL 25 MG PO CAPS
50.0000 mg | ORAL_CAPSULE | Freq: Once | ORAL | Status: AC
  Administered 2015-01-23: 50 mg via ORAL
  Filled 2015-01-23: qty 2

## 2015-01-23 MED ORDER — HYDROCHLOROTHIAZIDE 25 MG PO TABS: 25.0000 mg | ORAL_TABLET | Freq: Every day | ORAL | Status: DC

## 2015-01-23 NOTE — ED Provider Notes (Signed)
He is at least CSN: [id number]     Arrival date & time 01/22/15  2140 History   First MD Initiated Contact with Patient 01/23/15 0015     Chief Complaint  Patient presents with  . Headache     (Consider location/radiation/quality/duration/timing/severity/associated sxs/prior Treatment) HPI Comments:  Pmhx as above who presents with lightheadedness and right sided facial pain/headache that she says is typical for when her blood pressure has been elevated.  She states that she has had multiple episodes of similar symptoms in the past, though symptoms.  Used to be worse.  She states she takes hydrochlorothiazide for her blood pressure that has been out for about 2 weeks.  She denies fevers, chills, cough, congestion, runny nose, numbness, weakness or confusion.  She has not had difficulty walking.   Past Medical History  Diagnosis Date  . Asthma   . Hypertension    Past Surgical History  Procedure Laterality Date  . Cesarean section    . Tubal ligation     History reviewed. No pertinent family history. History  Substance Use Topics  . Smoking status: Never Smoker   . Smokeless tobacco: Not on file  . Alcohol Use: No   OB History    No data available     Review of Systems  Constitutional: Negative for fever, chills, diaphoresis, activity change, appetite change and fatigue.  HENT: Negative for congestion, facial swelling, rhinorrhea and sore throat.   Eyes: Negative for photophobia and discharge.  Respiratory: Negative for cough, chest tightness and shortness of breath.   Cardiovascular: Negative for chest pain, palpitations and leg swelling.  Gastrointestinal: Negative for nausea, vomiting, abdominal pain and diarrhea.  Endocrine: Negative for polydipsia and polyuria.  Genitourinary: Negative for dysuria, frequency, difficulty urinating and pelvic pain.  Musculoskeletal: Negative for back pain, arthralgias, neck pain and neck stiffness.  Skin: Negative for color change and  wound.  Allergic/Immunologic: Negative for immunocompromised state.  Neurological: Positive for light-headedness and headaches. Negative for facial asymmetry, weakness and numbness.  Hematological: Does not bruise/bleed easily.  Psychiatric/Behavioral: Negative for confusion and agitation.      Allergies  Zolpidem tartrate  Home Medications   Prior to Admission medications   Medication Sig Start Date End Date Taking? Authorizing Provider  albuterol (PROVENTIL HFA;VENTOLIN HFA) 108 (90 BASE) MCG/ACT inhaler Inhale 2 puffs into the lungs once. 01/22/12 01/21/13  Elson Areas, PA-C  albuterol (PROVENTIL HFA;VENTOLIN HFA) 108 (90 BASE) MCG/ACT inhaler Inhale 1-2 puffs into the lungs every 6 (six) hours as needed for wheezing or shortness of breath. 09/05/13   Felicie Morn, NP  benzonatate (TESSALON) 100 MG capsule Take 1 capsule (100 mg total) by mouth every 8 (eight) hours. 09/05/13   Felicie Morn, NP  cetirizine (ZYRTEC) 10 MG tablet Take 10 mg by mouth daily.    Historical Provider, MD  dextromethorphan (DELSYM) 30 MG/5ML liquid Take 75 mg by mouth daily as needed. For cough    Historical Provider, MD  fluticasone (FLONASE) 50 MCG/ACT nasal spray Place 1 spray into both nostrils daily. 09/04/14   Linwood Dibbles, MD  hydrochlorothiazide (HYDRODIURIL) 25 MG tablet Take 1 tablet (25 mg total) by mouth daily. 01/23/15   Toy Cookey, MD  HYDROcodone-acetaminophen (NORCO/VICODIN) 5-325 MG per tablet Take 1-2 tablets by mouth every 4 (four) hours as needed. 09/04/14   Linwood Dibbles, MD  lidocaine (XYLOCAINE) 2 % solution Use as directed 20 mLs in the mouth or throat every 4 (four) hours as needed for mouth pain.  01/15/14   Gerhard Munchobert Lockwood, MD  pseudoephedrine (SUDAFED 12 HOUR) 120 MG 12 hr tablet Take 1 tablet (120 mg total) by mouth 2 (two) times daily. 01/15/14   Gerhard Munchobert Lockwood, MD   BP 160/101 mmHg  Pulse 96  Temp(Src) 98.9 F (37.2 C) (Oral)  Resp 18  Ht 5\' 3"  (1.6 m)  Wt 207 lb (93.895 kg)  BMI  36.68 kg/m2  SpO2 100%  LMP 12/25/2014 Physical Exam  Constitutional: She is oriented to person, place, and time. She appears well-developed and well-nourished. No distress.  HENT:  Head: Normocephalic and atraumatic.  Mouth/Throat: No oropharyngeal exudate.  Eyes: Pupils are equal, round, and reactive to light.  Neck: Normal range of motion. Neck supple.  Cardiovascular: Normal rate, regular rhythm and normal heart sounds.  Exam reveals no gallop and no friction rub.   No murmur heard. Pulmonary/Chest: Effort normal and breath sounds normal. No respiratory distress. She has no wheezes. She has no rales.  Abdominal: Soft. Bowel sounds are normal. She exhibits no distension and no mass. There is no tenderness. There is no rebound and no guarding.  Musculoskeletal: Normal range of motion. She exhibits no edema or tenderness.  Neurological: She is alert and oriented to person, place, and time. She has normal strength. She displays no atrophy and no tremor. No cranial nerve deficit or sensory deficit. She exhibits normal muscle tone. She displays no seizure activity. Coordination and gait normal. GCS eye subscore is 4. GCS verbal subscore is 5. GCS motor subscore is 6.  Skin: Skin is warm and dry.  Psychiatric: She has a normal mood and affect.    ED Course  Procedures (including critical care time) Labs Review Labs Reviewed - No data to display  Imaging Review No results found.   EKG Interpretation None      MDM   Final diagnoses:  Acute nonintractable headache, unspecified headache type  Essential hypertension    Pt is a 39 y.o. female with Pmhx as above who presents with lightheadedness and right sided facial pain/headache that she says is typical for when her blood pressure has been elevated.  She states that she has had multiple episodes of similar symptoms in the past, though symptoms.  Used to be worse.  She states she takes hydrochlorothiazide for her blood pressure that  has been out for about 2 weeks.  She denies fevers, chills, cough, congestion, runny nose, numbness, weakness or confusion.  She has not had difficulty walking.  On physical exam vitals are stable and she is in no acute distress.  Neuro exam including ambulation is normal.  She does have tenderness over her right maxillary sinus has a history of recurrent maxillary sinusitis, however, without associated congestion, rhinorrhea or fevers, do not feel she requires antibiotic treatment.  Patient given by mouth, Reglan, Benadryl, naproxen for headache and department as well as home HCTZ.  Doubt CVA, TIA, subarachnoid hemorrhage or meningitis.  Blood pressure on my exam is 154/76, unlikely to cause an organ damage, though she states her normal blood pressure is 120s over 50s.   Patient's headache is improved after by mouth migraine cocktail And blood pressure improved without treatment.  Doubt CVA, TIA, subarachnoid hemorrhage, history is also not consistent with acute bacterial sinusitis.  She'll be given refill of hydrochlorothiazide and asked to follow-up with her primary doctor  Luberta Muttererri Kundinger evaluation in the Emergency Department is complete. It has been determined that no acute conditions requiring further emergency intervention are present at this  time. The patient/guardian have been advised of the diagnosis and plan. We have discussed signs and symptoms that warrant return to the ED, such as changes or worsening in symptoms, worsening headache, fever, numbness, weakness, confusion      Toy Cookey, MD 01/23/15 [phone]

## 2015-01-23 NOTE — Discharge Instructions (Signed)
Hypertension °Hypertension, commonly called high blood pressure, is when the force of blood pumping through your arteries is too strong. Your arteries are the blood vessels that carry blood from your heart throughout your body. A blood pressure reading consists of a higher number over a lower number, such as 110/72. The higher number (systolic) is the pressure inside your arteries when your heart pumps. The lower number (diastolic) is the pressure inside your arteries when your heart relaxes. Ideally you want your blood pressure below 120/80. °Hypertension forces your heart to work harder to pump blood. Your arteries may become narrow or stiff. Having hypertension puts you at risk for heart disease, stroke, and other problems.  °RISK FACTORS °Some risk factors for high blood pressure are controllable. Others are not.  °Risk factors you cannot control include:  °· Race. You may be at higher risk if you are African American. °· Age. Risk increases with age. °· Gender. Men are at higher risk than women before age 45 years. After age 65, women are at higher risk than men. °Risk factors you can control include: °· Not getting enough exercise or physical activity. °· Being overweight. °· Getting too much fat, sugar, calories, or salt in your diet. °· Drinking too much alcohol. °SIGNS AND SYMPTOMS °Hypertension does not usually cause signs or symptoms. Extremely high blood pressure (hypertensive crisis) may cause headache, anxiety, shortness of breath, and nosebleed. °DIAGNOSIS  °To check if you have hypertension, your health care provider will measure your blood pressure while you are seated, with your arm held at the level of your heart. It should be measured at least twice using the same arm. Certain conditions can cause a difference in blood pressure between your right and left arms. A blood pressure reading that is higher than normal on one occasion does not mean that you need treatment. If one blood pressure reading  is high, ask your health care provider about having it checked again. °TREATMENT  °Treating high blood pressure includes making lifestyle changes and possibly taking medicine. Living a healthy lifestyle can help lower high blood pressure. You may need to change some of your habits. °Lifestyle changes may include: °· Following the DASH diet. This diet is high in fruits, vegetables, and whole grains. It is low in salt, red meat, and added sugars. °· Getting at least 2½ hours of brisk physical activity every week. °· Losing weight if necessary. °· Not smoking. °· Limiting alcoholic beverages. °· Learning ways to reduce stress. ° If lifestyle changes are not enough to get your blood pressure under control, your health care provider may prescribe medicine. You may need to take more than one. Work closely with your health care provider to understand the risks and benefits. °HOME CARE INSTRUCTIONS °· Have your blood pressure rechecked as directed by your health care provider.   °· Take medicines only as directed by your health care provider. Follow the directions carefully. Blood pressure medicines must be taken as prescribed. The medicine does not work as well when you skip doses. Skipping doses also puts you at risk for problems.   °· Do not smoke.   °· Monitor your blood pressure at home as directed by your health care provider.  °SEEK MEDICAL CARE IF:  °· You think you are having a reaction to medicines taken. °· You have recurrent headaches or feel dizzy. °· You have swelling in your ankles. °· You have trouble with your vision. °SEEK IMMEDIATE MEDICAL CARE IF: °· You develop a severe headache or confusion. °·   You have unusual weakness, numbness, or feel faint. °· You have severe chest or abdominal pain. °· You vomit repeatedly. °· You have trouble breathing. °MAKE SURE YOU:  °· Understand these instructions. °· Will watch your condition. °· Will get help right away if you are not doing well or get worse. °Document  Released: 10/10/2005 Document Revised: 02/24/2014 Document Reviewed: 08/02/2013 °ExitCare® Patient Information ©2015 [employer]. This information is not intended to replace advice given to you by your health care provider. Make sure you discuss any questions you have with your health care provider. ° °Headaches, Frequently Asked Questions °MIGRAINE HEADACHES °Q: What is migraine? What causes it? How can I treat it? °A: Generally, migraine headaches begin as a dull ache. Then they develop into a constant, throbbing, and pulsating pain. You may experience pain at the temples. You may experience pain at the front or back of one or both sides of the head. The pain is usually accompanied by a combination of: °· Nausea. °· Vomiting. °· Sensitivity to light and noise. °Some people (about 15%) experience an aura (see below) before an attack. The cause of migraine is believed to be chemical reactions in the brain. Treatment for migraine may include over-the-counter or prescription medications. It may also include self-help techniques. These include relaxation training and biofeedback.  °Q: What is an aura? °A: About 15% of people with migraine get an "aura". This is a sign of neurological symptoms that occur before a migraine headache. You may see wavy or jagged lines, dots, or flashing lights. You might experience tunnel vision or blind spots in one or both eyes. The aura can include visual or auditory hallucinations (something imagined). It may include disruptions in smell (such as strange odors), taste or touch. Other symptoms include: °· Numbness. °· A "pins and needles" sensation. °· Difficulty in recalling or speaking the correct word. °These neurological events may last as long as 60 minutes. These symptoms will fade as the headache begins. °Q: What is a trigger? °A: Certain physical or environmental factors can lead to or "trigger" a migraine. These include: °· Foods. °· Hormonal  changes. °· Weather. °· Stress. °It is important to remember that triggers are different for everyone. To help prevent migraine attacks, you need to figure out which triggers affect you. Keep a headache diary. This is a good way to track triggers. The diary will help you talk to your healthcare professional about your condition. °Q: Does weather affect migraines? °A: Bright sunshine, hot, humid conditions, and drastic changes in barometric pressure may lead to, or "trigger," a migraine attack in some people. But studies have shown that weather does not act as a trigger for everyone with migraines. °Q: What is the link between migraine and hormones? °A: Hormones start and regulate many of your body's functions. Hormones keep your body in balance within a constantly changing environment. The levels of hormones in your body are unbalanced at times. Examples are during menstruation, pregnancy, or menopause. That can lead to a migraine attack. In fact, about three quarters of all women with migraine report that their attacks are related to the menstrual cycle.  °Q: Is there an increased risk of stroke for migraine sufferers? °A: The likelihood of a migraine attack causing a stroke is very remote. That is not to say that migraine sufferers cannot have a stroke associated with their migraines. In persons under age 40, the most common associated factor for stroke is migraine headache. But over the course of a   person's normal life span, the occurrence of migraine headache may actually be associated with a reduced risk of dying from cerebrovascular disease due to stroke.  °Q: What are acute medications for migraine? °A: Acute medications are used to treat the pain of the headache after it has started. Examples over-the-counter medications, NSAIDs, ergots, and triptans.  °Q: What are the triptans? °A: Triptans are the newest class of abortive medications. They are specifically targeted to treat migraine. Triptans are  vasoconstrictors. They moderate some chemical reactions in the brain. The triptans work on receptors in your brain. Triptans help to restore the balance of a neurotransmitter called serotonin. Fluctuations in levels of serotonin are thought to be a main cause of migraine.  °Q: Are over-the-counter medications for migraine effective? °A: Over-the-counter, or "OTC," medications may be effective in relieving mild to moderate pain and associated symptoms of migraine. But you should see your caregiver before beginning any treatment regimen for migraine.  °Q: What are preventive medications for migraine? °A: Preventive medications for migraine are sometimes referred to as "prophylactic" treatments. They are used to reduce the frequency, severity, and length of migraine attacks. Examples of preventive medications include antiepileptic medications, antidepressants, beta-blockers, calcium channel blockers, and NSAIDs (nonsteroidal anti-inflammatory drugs). °Q: Why are anticonvulsants used to treat migraine? °A: During the past few years, there has been an increased interest in antiepileptic drugs for the prevention of migraine. They are sometimes referred to as "anticonvulsants". Both epilepsy and migraine may be caused by similar reactions in the brain.  °Q: Why are antidepressants used to treat migraine? °A: Antidepressants are typically used to treat people with depression. They may reduce migraine frequency by regulating chemical levels, such as serotonin, in the brain.  °Q: What alternative therapies are used to treat migraine? °A: The term "alternative therapies" is often used to describe treatments considered outside the scope of conventional Western medicine. Examples of alternative therapy include acupuncture, acupressure, and yoga. Another common alternative treatment is herbal therapy. Some herbs are believed to relieve headache pain. Always discuss alternative therapies with your caregiver before proceeding. Some  herbal products contain arsenic and other toxins. °TENSION HEADACHES °Q: What is a tension-type headache? What causes it? How can I treat it? °A: Tension-type headaches occur randomly. They are often the result of temporary stress, anxiety, fatigue, or anger. Symptoms include soreness in your temples, a tightening band-like sensation around your head (a "vice-like" ache). Symptoms can also include a pulling feeling, pressure sensations, and contracting head and neck muscles. The headache begins in your forehead, temples, or the back of your head and neck. Treatment for tension-type headache may include over-the-counter or prescription medications. Treatment may also include self-help techniques such as relaxation training and biofeedback. °CLUSTER HEADACHES °Q: What is a cluster headache? What causes it? How can I treat it? °A: Cluster headache gets its name because the attacks come in groups. The pain arrives with little, if any, warning. It is usually on one side of the head. A tearing or bloodshot eye and a runny nose on the same side of the headache may also accompany the pain. Cluster headaches are believed to be caused by chemical reactions in the brain. They have been described as the most severe and intense of any headache type. Treatment for cluster headache includes prescription medication and oxygen. °SINUS HEADACHES °Q: What is a sinus headache? What causes it? How can I treat it? °A: When a cavity in the bones of the face and skull (a sinus) becomes   inflamed, the inflammation will cause localized pain. This condition is usually the result of an allergic reaction, a tumor, or an infection. If your headache is caused by a sinus blockage, such as an infection, you will probably have a fever. An x-ray will confirm a sinus blockage. Your caregiver's treatment might include antibiotics for the infection, as well as antihistamines or decongestants.  °REBOUND HEADACHES °Q: What is a rebound headache? What  causes it? How can I treat it? °A: A pattern of taking acute headache medications too often can lead to a condition known as "rebound headache." A pattern of taking too much headache medication includes taking it more than 2 days per week or in excessive amounts. That means more than the label or a caregiver advises. With rebound headaches, your medications not only stop relieving pain, they actually begin to cause headaches. Doctors treat rebound headache by tapering the medication that is being overused. Sometimes your caregiver will gradually substitute a different type of treatment or medication. Stopping may be a challenge. Regularly overusing a medication increases the potential for serious side effects. Consult a caregiver if you regularly use headache medications more than 2 days per week or more than the label advises. °ADDITIONAL QUESTIONS AND ANSWERS °Q: What is biofeedback? °A: Biofeedback is a self-help treatment. Biofeedback uses special equipment to monitor your body's involuntary physical responses. Biofeedback monitors: °· Breathing. °· Pulse. °· Heart rate. °· Temperature. °· Muscle tension. °· Brain activity. °Biofeedback helps you refine and perfect your relaxation exercises. You learn to control the physical responses that are related to stress. Once the technique has been mastered, you do not need the equipment any more. °Q: Are headaches hereditary? °A: Four out of five (80%) of people that suffer report a family history of migraine. Scientists are not sure if this is genetic or a family predisposition. Despite the uncertainty, a child has a 50% chance of having migraine if one parent suffers. The child has a 75% chance if both parents suffer.  °Q: Can children get headaches? °A: By the time they reach high school, most young people have experienced some type of headache. Many safe and effective approaches or medications can prevent a headache from occurring or stop it after it has begun.  °Q:  What type of doctor should I see to diagnose and treat my headache? °A: Start with your primary caregiver. Discuss his or her experience and approach to headaches. Discuss methods of classification, diagnosis, and treatment. Your caregiver may decide to recommend you to a headache specialist, depending upon your symptoms or other physical conditions. Having diabetes, allergies, etc., may require a more comprehensive and inclusive approach to your headache. The [employer] will provide, upon request, a list of NHF physician members in your state. °Document Released: 12/31/2003 Document Revised: 01/02/2012 Document Reviewed: 06/09/2008 °Exit[employer]® Patient Information ©2015 [employer]. This information is not intended to replace advice given to you by your health care provider. Make sure you discuss any questions you have with your health care provider. ° °

## 2015-05-26 ENCOUNTER — Encounter (HOSPITAL_BASED_OUTPATIENT_CLINIC_OR_DEPARTMENT_OTHER): Payer: Self-pay | Admitting: Emergency Medicine

## 2015-05-26 ENCOUNTER — Emergency Department (HOSPITAL_BASED_OUTPATIENT_CLINIC_OR_DEPARTMENT_OTHER)
Admission: EM | Admit: 2015-05-26 | Discharge: 2015-05-27 | Disposition: A | Discharge status: Home / Self Care | Payer: Medicaid Other | Attending: Emergency Medicine | Admitting: Emergency Medicine

## 2015-05-26 DIAGNOSIS — Z7951 Long term (current) use of inhaled steroids: Secondary | ICD-10-CM | POA: Insufficient documentation

## 2015-05-26 DIAGNOSIS — I10 Essential (primary) hypertension: Secondary | ICD-10-CM | POA: Insufficient documentation

## 2015-05-26 DIAGNOSIS — J029 Acute pharyngitis, unspecified: Secondary | ICD-10-CM | POA: Insufficient documentation

## 2015-05-26 DIAGNOSIS — J028 Acute pharyngitis due to other specified organisms: Secondary | ICD-10-CM

## 2015-05-26 DIAGNOSIS — Z79899 Other long term (current) drug therapy: Secondary | ICD-10-CM | POA: Insufficient documentation

## 2015-05-26 DIAGNOSIS — J45909 Unspecified asthma, uncomplicated: Secondary | ICD-10-CM | POA: Insufficient documentation

## 2015-05-26 DIAGNOSIS — B9789 Other viral agents as the cause of diseases classified elsewhere: Secondary | ICD-10-CM

## 2015-05-26 DIAGNOSIS — H9201 Otalgia, right ear: Secondary | ICD-10-CM | POA: Insufficient documentation

## 2015-05-26 LAB — RAPID STREP SCREEN (MED CTR MEBANE ONLY): STREPTOCOCCUS, GROUP A SCREEN (DIRECT): NEGATIVE

## 2015-05-26 MED ORDER — LIDOCAINE VISCOUS 2 % MT SOLN
15.0000 mL | Freq: Once | OROMUCOSAL | Status: AC
  Administered 2015-05-26: 15 mL via OROMUCOSAL
  Filled 2015-05-26: qty 15

## 2015-05-26 MED ORDER — DEXAMETHASONE SODIUM PHOSPHATE 10 MG/ML IJ SOLN
INTRAMUSCULAR | Status: AC
  Filled 2015-05-26: qty 1

## 2015-05-26 MED ORDER — DEXAMETHASONE 4 MG PO TABS
10.0000 mg | ORAL_TABLET | Freq: Once | ORAL | Status: AC
  Administered 2015-05-26: 10 mg via ORAL

## 2015-05-26 NOTE — ED Provider Notes (Signed)
CSN: [id number]     Arrival date & time 05/26/15  2228 History  This chart was scribed for Ellen Fossa, MD by Doreatha Martin, ED Scribe. This patient was seen in room MH06/MH06 and the patient's care was started at 11:20 PM.     Chief Complaint  Patient presents with  . Cough  . Sore Throat   The history is provided by the patient. No language interpreter was used.    HPI Comments: Ellen Marks is a 39 y.o. female with Hx of asthma and HTN who presents to the Emergency Department complaining of moderate, gradually worsening sore throat on the left side onset today. Pt states associated right sided otalgia. She states that she recently had a cold and cough onset 2 weeks ago that has been improving. She notes that pain is worsened by eating. She also notes that pain is unaffected by OTC pain medications or saltwater rinses. Pt is currently taking hydrodiuril and Proventil. She denies pregnancy. She also denies fever, sores on the hands or feet.   Past Medical History  Diagnosis Date  . Asthma   . Hypertension    Past Surgical History  Procedure Laterality Date  . Cesarean section    . Tubal ligation     History reviewed. No pertinent family history. History  Substance Use Topics  . Smoking status: Never Smoker   . Smokeless tobacco: Not on file  . Alcohol Use: No   OB History    No data available     Review of Systems  Constitutional: Negative for fever.  HENT: Positive for ear pain and sore throat.   Respiratory: Positive for cough.   Skin: Negative for wound.  All other systems reviewed and are negative.   Allergies  Zolpidem tartrate  Home Medications   Prior to Admission medications   Medication Sig Start Date End Date Taking? Authorizing Provider  albuterol (PROVENTIL HFA;VENTOLIN HFA) 108 (90 BASE) MCG/ACT inhaler Inhale 2 puffs into the lungs once. 01/22/12 01/21/13  Elson Areas, PA-C  albuterol (PROVENTIL HFA;VENTOLIN HFA) 108 (90 BASE) MCG/ACT inhaler Inhale 1-2  puffs into the lungs every 6 (six) hours as needed for wheezing or shortness of breath. 09/05/13   Felicie Morn, NP  benzonatate (TESSALON) 100 MG capsule Take 1 capsule (100 mg total) by mouth every 8 (eight) hours. 09/05/13   Felicie Morn, NP  cetirizine (ZYRTEC) 10 MG tablet Take 10 mg by mouth daily.    Historical Provider, MD  dextromethorphan (DELSYM) 30 MG/5ML liquid Take 75 mg by mouth daily as needed. For cough    Historical Provider, MD  fluticasone (FLONASE) 50 MCG/ACT nasal spray Place 1 spray into both nostrils daily. 09/04/14   Linwood Dibbles, MD  hydrochlorothiazide (HYDRODIURIL) 25 MG tablet Take 1 tablet (25 mg total) by mouth daily. 01/23/15   Toy Cookey, MD  HYDROcodone-acetaminophen (NORCO/VICODIN) 5-325 MG per tablet Take 1-2 tablets by mouth every 4 (four) hours as needed. 09/04/14   Linwood Dibbles, MD  lidocaine (XYLOCAINE) 2 % solution Use as directed 20 mLs in the mouth or throat every 4 (four) hours as needed for mouth pain. 01/15/14   Gerhard Munch, MD  pseudoephedrine (SUDAFED 12 HOUR) 120 MG 12 hr tablet Take 1 tablet (120 mg total) by mouth 2 (two) times daily. 01/15/14   Gerhard Munch, MD   BP 157/98 mmHg  Pulse 108  Temp(Src) 98.3 F (36.8 C) (Oral)  Resp 20  Ht  (1.6 m)  Wt 207 lb (93.895  kg)  BMI 36.68 kg/m2  SpO2 95%  LMP 05/06/2015 Physical Exam  Constitutional: She is oriented to person, place, and time. She appears well-developed and well-nourished.  HENT:  Head: Normocephalic and atraumatic.  Right Ear: External ear normal.  Left Ear: External ear normal.  Left posterior oropharynx with moderate erythema and mild swelling. There are multiple white patches and exudate over the roof of the left posterior oropharynx and tonsillar region.  Neck: Neck supple.  Cardiovascular: Normal rate and regular rhythm.   No murmur heard. Pulmonary/Chest: Effort normal and breath sounds normal. No respiratory distress.  No stridor  Abdominal: Soft. There is no  tenderness. There is no rebound and no guarding.  Musculoskeletal: She exhibits no edema or tenderness.  Neurological: She is alert and oriented to person, place, and time.  Skin: Skin is warm and dry.  Psychiatric: She has a normal mood and affect. Her behavior is normal.  Nursing note and vitals reviewed.   ED Course  Procedures (including critical care time) DIAGNOSTIC STUDIES: Oxygen Saturation is 95% on RA, adequate by my interpretation.    COORDINATION OF CARE: 11:25 PM Discussed treatment plan with pt at bedside and pt agreed to plan.   Labs Review Labs Reviewed  RAPID STREP SCREEN (NOT AT [facility])    Imaging Review No results found.   EKG Interpretation None      MDM   Final diagnoses:  Acute viral pharyngitis   Patient here for evaluation of sore throat, cough. Patient is nontoxic appearing on exam with no evidence of respiratory distress. Posterior oropharynx exam is asymmetric with patches isolated to the left side of the posterior oropharynx. There is no evidence of thrush, peritonsillar abscess, acute otitis media, or PTA. Discussed with patient home care for viral pharyngitis. Provided one-time dose of Decadron for symptom relief. Return precautions were discussed.  I personally performed the services described in this documentation, which was scribed in my presence. The recorded information has been reviewed and is accurate.   Ellen Fossa, MD 05/27/15 [phone]

## 2015-05-26 NOTE — ED Notes (Signed)
patient states that her throat has started to hurt today after having several days of a cough and cold

## 2015-05-27 NOTE — Discharge Instructions (Signed)

## 2015-05-29 LAB — CULTURE, GROUP A STREP: STREP A CULTURE: NEGATIVE

## 2015-07-18 ENCOUNTER — Emergency Department (HOSPITAL_BASED_OUTPATIENT_CLINIC_OR_DEPARTMENT_OTHER)
Admission: EM | Admit: 2015-07-18 | Discharge: 2015-07-18 | Disposition: A | Discharge status: Home / Self Care | Payer: Medicaid Other | Attending: Physician Assistant | Admitting: Physician Assistant

## 2015-07-18 ENCOUNTER — Encounter (HOSPITAL_BASED_OUTPATIENT_CLINIC_OR_DEPARTMENT_OTHER): Payer: Self-pay | Admitting: Emergency Medicine

## 2015-07-18 ENCOUNTER — Emergency Department (HOSPITAL_BASED_OUTPATIENT_CLINIC_OR_DEPARTMENT_OTHER): Payer: Medicaid Other

## 2015-07-18 DIAGNOSIS — I10 Essential (primary) hypertension: Secondary | ICD-10-CM | POA: Insufficient documentation

## 2015-07-18 DIAGNOSIS — Z79899 Other long term (current) drug therapy: Secondary | ICD-10-CM | POA: Insufficient documentation

## 2015-07-18 DIAGNOSIS — M79661 Pain in right lower leg: Secondary | ICD-10-CM | POA: Insufficient documentation

## 2015-07-18 DIAGNOSIS — M79604 Pain in right leg: Secondary | ICD-10-CM

## 2015-07-18 DIAGNOSIS — Z7951 Long term (current) use of inhaled steroids: Secondary | ICD-10-CM | POA: Insufficient documentation

## 2015-07-18 DIAGNOSIS — J45909 Unspecified asthma, uncomplicated: Secondary | ICD-10-CM | POA: Insufficient documentation

## 2015-07-18 LAB — CBC WITH DIFFERENTIAL/PLATELET
BASOS PCT: 1 %
Basophils Absolute: 0.1 10*3/uL (ref 0.0–0.1)
EOS ABS: 0.4 10*3/uL (ref 0.0–0.7)
Eosinophils Relative: 3 %
HEMATOCRIT: 37.8 % (ref 36.0–46.0)
HEMOGLOBIN: 12.2 g/dL (ref 12.0–15.0)
LYMPHS ABS: 4.1 10*3/uL — AB (ref 0.7–4.0)
Lymphocytes Relative: 33 %
MCH: 24.5 pg — AB (ref 26.0–34.0)
MCHC: 32.3 g/dL (ref 30.0–36.0)
MCV: 75.9 fL — ABNORMAL LOW (ref 78.0–100.0)
MONOS PCT: 6 %
Monocytes Absolute: 0.7 10*3/uL (ref 0.1–1.0)
NEUTROS ABS: 7.3 10*3/uL (ref 1.7–7.7)
NEUTROS PCT: 57 %
Platelets: 471 10*3/uL — ABNORMAL HIGH (ref 150–400)
RBC: 4.98 MIL/uL (ref 3.87–5.11)
RDW: 14.1 % (ref 11.5–15.5)
WBC: 12.5 10*3/uL — AB (ref 4.0–10.5)

## 2015-07-18 LAB — BASIC METABOLIC PANEL
Anion gap: 9 (ref 5–15)
BUN: 12 mg/dL (ref 6–20)
CHLORIDE: 101 mmol/L (ref 101–111)
CO2: 27 mmol/L (ref 22–32)
CREATININE: 0.78 mg/dL (ref 0.44–1.00)
Calcium: 8.9 mg/dL (ref 8.9–10.3)
GFR calc non Af Amer: >60 mL/min (ref >60)
Glucose, Bld: 105 mg/dL — ABNORMAL HIGH (ref 65–99)
POTASSIUM: 3.4 mmol/L — AB (ref 3.5–5.1)
SODIUM: 137 mmol/L (ref 135–145)

## 2015-07-18 MED ORDER — HYDROMORPHONE HCL 1 MG/ML IJ SOLN
0.5000 mg | Freq: Once | INTRAMUSCULAR | Status: AC
  Administered 2015-07-18: 0.5 mg via INTRAVENOUS
  Filled 2015-07-18: qty 1

## 2015-07-18 NOTE — ED Notes (Signed)
Patient states that she is having right knee pain. Denies any known trauma

## 2015-07-18 NOTE — ED Provider Notes (Signed)
CSN: [id number]     Arrival date & time 07/18/15  1418 History   First MD Initiated Contact with Patient 07/18/15 1437     Chief Complaint  Patient presents with  . Knee Pain     (Consider location/radiation/quality/duration/timing/severity/associated sxs/prior Treatment) Patient is a 39 y.o. female presenting with knee pain.  Knee Pain Location:  Knee Time since incident:  1 week Injury: no   Knee location:  R knee Pain details:    Quality:  Aching   Radiates to:  Does not radiate   Severity:  Moderate   Onset quality:  Sudden   Timing:  Intermittent   Progression:  Unchanged Relieved by:  Nothing Worsened by:  Bearing weight, flexion and extension (palpation) Ineffective treatments:  None tried Associated symptoms: no back pain, no muscle weakness, no neck pain and no numbness     Past Medical History  Diagnosis Date  . Asthma   . Hypertension    Past Surgical History  Procedure Laterality Date  . Cesarean section    . Tubal ligation     History reviewed. No pertinent family history. Social History  Substance Use Topics  . Smoking status: Never Smoker   . Smokeless tobacco: None  . Alcohol Use: No   OB History    No data available     Review of Systems  Musculoskeletal: Negative for back pain and neck pain.  All other systems reviewed and are negative.     Allergies  Zolpidem tartrate  Home Medications   Prior to Admission medications   Medication Sig Start Date End Date Taking? Authorizing Provider  albuterol (PROVENTIL HFA;VENTOLIN HFA) 108 (90 BASE) MCG/ACT inhaler Inhale 2 puffs into the lungs once. 01/22/12 01/21/13  Elson Areas, PA-C  albuterol (PROVENTIL HFA;VENTOLIN HFA) 108 (90 BASE) MCG/ACT inhaler Inhale 1-2 puffs into the lungs every 6 (six) hours as needed for wheezing or shortness of breath. 09/05/13   Felicie Morn, NP  benzonatate (TESSALON) 100 MG capsule Take 1 capsule (100 mg total) by mouth every 8 (eight) hours. 09/05/13   Felicie Morn, NP  cetirizine (ZYRTEC) 10 MG tablet Take 10 mg by mouth daily.    Historical Provider, MD  dextromethorphan (DELSYM) 30 MG/5ML liquid Take 75 mg by mouth daily as needed. For cough    Historical Provider, MD  fluticasone (FLONASE) 50 MCG/ACT nasal spray Place 1 spray into both nostrils daily. 09/04/14   Linwood Dibbles, MD  hydrochlorothiazide (HYDRODIURIL) 25 MG tablet Take 1 tablet (25 mg total) by mouth daily. 01/23/15   Toy Cookey, MD  HYDROcodone-acetaminophen (NORCO/VICODIN) 5-325 MG per tablet Take 1-2 tablets by mouth every 4 (four) hours as needed. 09/04/14   Linwood Dibbles, MD  lidocaine (XYLOCAINE) 2 % solution Use as directed 20 mLs in the mouth or throat every 4 (four) hours as needed for mouth pain. 01/15/14   Gerhard Munch, MD  pseudoephedrine (SUDAFED 12 HOUR) 120 MG 12 hr tablet Take 1 tablet (120 mg total) by mouth 2 (two) times daily. 01/15/14   Gerhard Munch, MD   BP 167/85 mmHg  Pulse 94  Temp(Src) 98 F (36.7 C) (Oral)  Resp 18  Ht  (1.6 m)  Wt 210 lb (95.255 kg)  BMI 37.21 kg/m2  SpO2 100%  LMP 07/04/2015 Physical Exam  Constitutional: She is oriented to person, place, and time. She appears well-developed and well-nourished.  HENT:  Head: Normocephalic and atraumatic.  Right Ear: External ear normal.  Left Ear: External ear  normal.  Eyes: Conjunctivae and EOM are normal. Pupils are equal, round, and reactive to light.  Neck: Normal range of motion. Neck supple.  Cardiovascular: Normal rate, regular rhythm, normal heart sounds and intact distal pulses.   Pulmonary/Chest: Effort normal and breath sounds normal.  Abdominal: Soft. Bowel sounds are normal. There is no tenderness.  Musculoskeletal: Normal range of motion.       Right knee: She exhibits normal range of motion, no swelling and no effusion. Tenderness found.       Right lower leg: She exhibits tenderness. She exhibits no swelling.  Neurological: She is alert and oriented to person, place, and  time.  Skin: Skin is warm and dry.  Vitals reviewed.   ED Course  Procedures (including critical care time) Labs Review Labs Reviewed  CBC WITH DIFFERENTIAL/PLATELET - Abnormal; Notable for the following:    WBC 12.5 (*)    MCV 75.9 (*)    MCH 24.5 (*)    Platelets 471 (*)    Lymphs Abs 4.1 (*)    All other components within normal limits  BASIC METABOLIC PANEL - Abnormal; Notable for the following:    Potassium 3.4 (*)    Glucose, Bld 105 (*)    All other components within normal limits    Imaging Review US Venous Img Lower Unilateral Right  07/18/2015   CLINICAL DATA:  Right posterior knee pain radiating into upper calf x3 days, no known injury  EXAM: RIGHT LOWER EXTREMITY VENOUS DOPPLER ULTRASOUND  TECHNIQUE: Gray-scale sonography with graded compression, as well as color Doppler and duplex ultrasound were performed to evaluate the lower extremity deep venous systems from the level of the common femoral vein and including the common femoral, femoral, profunda femoral, popliteal and calf veins including the posterior tibial, peroneal and gastrocnemius veins when visible. The superficial great saphenous vein was also interrogated. Spectral Doppler was utilized to evaluate flow at rest and with distal augmentation maneuvers in the common femoral, femoral and popliteal veins.  COMPARISON:  None.  FINDINGS: Contralateral Common Femoral Vein: Respiratory phasicity is normal and symmetric with the symptomatic side. No evidence of thrombus. Normal compressibility.  Common Femoral Vein: No evidence of thrombus. Normal compressibility, respiratory phasicity and response to augmentation.  Saphenofemoral Junction: No evidence of thrombus. Normal compressibility and flow on color Doppler imaging.  Profunda Femoral Vein: No evidence of thrombus. Normal compressibility and flow on color Doppler imaging.  Femoral Vein: No evidence of thrombus. Normal compressibility, respiratory phasicity and response to  augmentation.  Popliteal Vein: No evidence of thrombus. Normal compressibility, respiratory phasicity and response to augmentation.  Calf Veins: No evidence of thrombus. Normal compressibility and flow on color Doppler imaging.  Superficial Great Saphenous Vein: No evidence of thrombus. Normal compressibility and flow on color Doppler imaging.  Venous Reflux:  None.  Other Findings:  None.  IMPRESSION: No deep venous thrombosis in the visualized right lower extremity.   Electronically Signed   By: Charline Bills M.D.   On: 07/18/2015 15:35   Dg Knee Complete 4 Views Right  07/18/2015   CLINICAL DATA:  Right knee pain for 1-2 weeks.  No known injury.  EXAM: RIGHT KNEE - COMPLETE 4+ VIEW  COMPARISON:  None.  FINDINGS: There is no evidence of fracture, dislocation, or joint effusion. There is no evidence of arthropathy or other focal bone abnormality. Soft tissues are unremarkable.  IMPRESSION: Negative.   Electronically Signed   By: Charlett Nose M.D.   On: 07/18/2015 15:44  I have personally reviewed and evaluated these images and lab results as part of my medical decision-making.   EKG Interpretation None      MDM   Final diagnoses:  Right leg pain    39 y.o. female with pertinent PMH of asthma, HTN presents with atraumatic R knee pain beginning last week.  No fevers or systemic symptoms.  Physical exam today as above with prominent calf tenderness, FROM of knee without warmth.  Plan for DVT study, pt care to Dr. Juliann Pares pending planned dc if negative.    I have reviewed all laboratory and imaging studies if ordered as above  1. Right leg pain         Mirian Mo, MD 07/19/15 [phone]

## 2015-07-18 NOTE — ED Notes (Signed)
Pt took  tylenol at approx 1045am today.

## 2015-07-18 NOTE — Discharge Instructions (Signed)
Your Xray and [place] were both normal today. Please follow up with your regular physician.

## 2015-07-18 NOTE — ED Notes (Signed)
MD at bedside. 

## 2015-11-30 ENCOUNTER — Encounter (HOSPITAL_BASED_OUTPATIENT_CLINIC_OR_DEPARTMENT_OTHER): Payer: Self-pay | Admitting: *Deleted

## 2015-11-30 ENCOUNTER — Emergency Department (HOSPITAL_BASED_OUTPATIENT_CLINIC_OR_DEPARTMENT_OTHER)
Admission: EM | Admit: 2015-11-30 | Discharge: 2015-11-30 | Disposition: A | Discharge status: Home / Self Care | Payer: Medicaid Other | Attending: Emergency Medicine | Admitting: Emergency Medicine

## 2015-11-30 DIAGNOSIS — Z79899 Other long term (current) drug therapy: Secondary | ICD-10-CM | POA: Insufficient documentation

## 2015-11-30 DIAGNOSIS — R51 Headache: Secondary | ICD-10-CM | POA: Insufficient documentation

## 2015-11-30 DIAGNOSIS — R030 Elevated blood-pressure reading, without diagnosis of hypertension: Secondary | ICD-10-CM

## 2015-11-30 DIAGNOSIS — R519 Headache, unspecified: Secondary | ICD-10-CM

## 2015-11-30 DIAGNOSIS — Z7951 Long term (current) use of inhaled steroids: Secondary | ICD-10-CM | POA: Insufficient documentation

## 2015-11-30 DIAGNOSIS — I10 Essential (primary) hypertension: Secondary | ICD-10-CM | POA: Diagnosis not present

## 2015-11-30 DIAGNOSIS — J45909 Unspecified asthma, uncomplicated: Secondary | ICD-10-CM | POA: Insufficient documentation

## 2015-11-30 DIAGNOSIS — IMO0001 Reserved for inherently not codable concepts without codable children: Secondary | ICD-10-CM

## 2015-11-30 MED ORDER — MAGNESIUM SULFATE 2 GM/50ML IV SOLN
2.0000 g | Freq: Once | INTRAVENOUS | Status: AC
  Administered 2015-11-30: 2 g via INTRAVENOUS
  Filled 2015-11-30: qty 50

## 2015-11-30 MED ORDER — DIPHENHYDRAMINE HCL 50 MG/ML IJ SOLN
25.0000 mg | Freq: Once | INTRAMUSCULAR | Status: AC
  Administered 2015-11-30: 25 mg via INTRAVENOUS
  Filled 2015-11-30: qty 1

## 2015-11-30 MED ORDER — HYDROCHLOROTHIAZIDE 25 MG PO TABS: 25.0000 mg | ORAL_TABLET | Freq: Every day | ORAL | Status: DC

## 2015-11-30 MED ORDER — METOCLOPRAMIDE HCL 5 MG/ML IJ SOLN
10.0000 mg | Freq: Once | INTRAMUSCULAR | Status: AC
  Administered 2015-11-30: 10 mg via INTRAVENOUS
  Filled 2015-11-30: qty 2

## 2015-11-30 MED ORDER — KETOROLAC TROMETHAMINE 30 MG/ML IJ SOLN
30.0000 mg | Freq: Once | INTRAMUSCULAR | Status: AC
  Administered 2015-11-30: 30 mg via INTRAVENOUS
  Filled 2015-11-30: qty 1

## 2015-11-30 NOTE — ED Notes (Signed)
Pt is here for HA which began Friday.  Pt states that she has HA which she describes as severe and has caused her to miss work.  Pt also reports some sinus congestion and that she has been out of her BP meds for 2 months and she wonders if her HA could be caused by high BP.  Pt has had numbness on the right side of her face since yesterday ( with some tingling) Pt also has some numbness in top part of her right arm and in her fingers which began yesterday.  Pt has some right arm weakness when asked to squeeze my hand and also some right arm drift on exam.  Pt is alert and oriented and speech is clear.  Pt is tearful when describing her symptoms.

## 2015-11-30 NOTE — Discharge Instructions (Signed)
Please follow-up with your doctor or use the attached resource guide to help establish primary care. Take your blood pressure medications as prescribed. Return to ED for any new or worsening symptoms.  General Headache Without Cause A headache is pain or discomfort felt around the head or neck area. The specific cause of a headache may not be found. There are many causes and types of headaches. A few common ones are:  Tension headaches.  Migraine headaches.  Cluster headaches.  Chronic daily headaches. HOME CARE INSTRUCTIONS  Watch your condition for any changes. Take these steps to help with your condition: Managing Pain  Take over-the-counter and prescription medicines only as told by your health care provider.  Lie down in a dark, quiet room when you have a headache.  If directed, apply ice to the head and neck area:  Put ice in a plastic bag.  Place a towel between your skin and the bag.  Leave the ice on for 20 minutes, 2-3 times per day.  Use a heating pad or hot shower to apply heat to the head and neck area as told by your health care provider.  Keep lights dim if bright lights bother you or make your headaches worse. Eating and Drinking  Eat meals on a regular schedule.  Limit alcohol use.  Decrease the amount of caffeine you drink, or stop drinking caffeine. General Instructions  Keep all follow-up visits as told by your health care provider. This is important.  Keep a headache journal to help find out what may trigger your headaches. For example, write down:  What you eat and drink.  How much sleep you get.  Any change to your diet or medicines.  Try massage or other relaxation techniques.  Limit stress.  Sit up straight, and do not tense your muscles.  Do not use tobacco products, including cigarettes, chewing tobacco, or e-cigarettes. If you need help quitting, ask your health care provider.  Exercise regularly as told by your health care  provider.  Sleep on a regular schedule. Get 7-9 hours of sleep, or the amount recommended by your health care provider. SEEK MEDICAL CARE IF:   Your symptoms are not helped by medicine.  You have a headache that is different from the usual headache.  You have nausea or you vomit.  You have a fever. SEEK IMMEDIATE MEDICAL CARE IF:   Your headache becomes severe.  You have repeated vomiting.  You have a stiff neck.  You have a loss of vision.  You have problems with speech.  You have pain in the eye or ear.  You have muscular weakness or loss of muscle control.  You lose your balance or have trouble walking.  You feel faint or pass out.  You have confusion.   This information is not intended to replace advice given to you by your health care provider. Make sure you discuss any questions you have with your health care provider.   Document Released: 10/10/2005 Document Revised: 07/01/2015 Document Reviewed: 02/02/2015 Elsevier Interactive Patient Education [employer]

## 2015-11-30 NOTE — ED Notes (Signed)
Pt sts she gets this tingling and numbness on right side when her bp gets high.  Sts she has been out of her bp med x2 months and has not had any health ins until the past couple weeks.   Her pmd does not have any appts available until April 1st.

## 2015-11-30 NOTE — ED Provider Notes (Signed)
CSN: [id number]     Arrival date & time 11/30/15  1806 History   First MD Initiated Contact with Patient 11/30/15 1915     Chief Complaint  Patient presents with  . Migraine     (Consider location/radiation/quality/duration/timing/severity/associated sxs/prior Treatment) HPI Ellen Marks is a 40 y.o. female history of asthma and hypertension who comes in for evaluation of headache. Patient reports she will typically have these headaches when her blood pressure gets too high. She reports her blood pressure was elevated at [facility] previously today. She also reports she ran out of her blood pressure medication 3 months ago due to insurance reasons. She reports a right-sided headache with associated tingling in her right third and fourth finger which is typical of her previous headaches. She also reports associated mild sinus congestion. She denies any other numbness or weakness, vision changes, fevers or chills, neck pain. Has taken Tylenol without relief of her symptoms. No other modifying factors.  Past Medical History  Diagnosis Date  . Asthma   . Hypertension    Past Surgical History  Procedure Laterality Date  . Cesarean section    . Tubal ligation     No family history on file. Social History  Substance Use Topics  . Smoking status: Never Smoker   . Smokeless tobacco: None  . Alcohol Use: No   OB History    No data available     Review of Systems A 10 point review of systems was completed and was negative except for pertinent positives and negatives as mentioned in the history of present illness     Allergies  Zolpidem tartrate  Home Medications   Prior to Admission medications   Medication Sig Start Date End Date Taking? Authorizing Provider  albuterol (PROVENTIL HFA;VENTOLIN HFA) 108 (90 BASE) MCG/ACT inhaler Inhale 2 puffs into the lungs once. 01/22/12 01/21/13  Elson Areas, PA-C  albuterol (PROVENTIL HFA;VENTOLIN HFA) 108 (90 BASE) MCG/ACT inhaler Inhale 1-2 puffs  into the lungs every 6 (six) hours as needed for wheezing or shortness of breath. 09/05/13   Felicie Morn, NP  benzonatate (TESSALON) 100 MG capsule Take 1 capsule (100 mg total) by mouth every 8 (eight) hours. 09/05/13   Felicie Morn, NP  cetirizine (ZYRTEC) 10 MG tablet Take 10 mg by mouth daily.    Historical Provider, MD  dextromethorphan (DELSYM) 30 MG/5ML liquid Take 75 mg by mouth daily as needed. For cough    Historical Provider, MD  fluticasone (FLONASE) 50 MCG/ACT nasal spray Place 1 spray into both nostrils daily. 09/04/14   Linwood Dibbles, MD  hydrochlorothiazide (HYDRODIURIL) 25 MG tablet Take 1 tablet (25 mg total) by mouth daily. 11/30/15   Joycie Peek, PA-C  HYDROcodone-acetaminophen (NORCO/VICODIN) 5-325 MG per tablet Take 1-2 tablets by mouth every 4 (four) hours as needed. 09/04/14   Linwood Dibbles, MD  lidocaine (XYLOCAINE) 2 % solution Use as directed 20 mLs in the mouth or throat every 4 (four) hours as needed for mouth pain. 01/15/14   Gerhard Munch, MD  pseudoephedrine (SUDAFED 12 HOUR) 120 MG 12 hr tablet Take 1 tablet (120 mg total) by mouth 2 (two) times daily. 01/15/14   Gerhard Munch, MD   BP 156/92 mmHg  Pulse 90  Temp(Src) 97.9 F (36.6 C) (Oral)  Resp 18  Wt 122.925 kg  SpO2 98%  LMP 11/23/2015 Physical Exam  Constitutional: She is oriented to person, place, and time. She appears well-developed and well-nourished. No distress.  HENT:  Head: Normocephalic and  atraumatic.  Mouth/Throat: Oropharynx is clear and moist.  Eyes: Conjunctivae and EOM are normal. Pupils are equal, round, and reactive to light.  Neck: Normal range of motion.  No meningismus or nuchal rigidity  Cardiovascular: Normal rate, regular rhythm and normal heart sounds.   Pulmonary/Chest: Effort normal and breath sounds normal.  Abdominal: Soft. There is no tenderness.  Musculoskeletal: Normal range of motion. She exhibits no edema or tenderness.  Neurological: She is alert and oriented to person,  place, and time.  Cranial nerves III through XII grossly intact. Motor strength is 5/5 in all 4 extremities and sensation is intact to light touch. Completes finger to nose coordination movements without difficulty. No nystagmus. Gait is baseline without ataxia.  Skin: She is not diaphoretic.    ED Course  Procedures (including critical care time) Labs Review Labs Reviewed - No data to display  Imaging Review No results found. I have personally reviewed and evaluated these images and lab results as part of my medical decision-making.   EKG Interpretation None     Meds given in ED:  Medications  ketorolac (TORADOL) 30 MG/ML injection 30 mg (30 mg Intravenous Given 11/30/15 2021)  metoCLOPramide (REGLAN) injection 10 mg (10 mg Intravenous Given 11/30/15 2021)  diphenhydrAMINE (BENADRYL) injection 25 mg (25 mg Intravenous Given 11/30/15 2022)  magnesium sulfate IVPB 2 g 50 mL (0 g Intravenous Stopped 11/30/15 2203)    Discharge Medication List as of 11/30/2015  9:59 PM     Filed Vitals:   11/30/15 1818 11/30/15 1946 11/30/15 2137  BP: 158/119 164/82 156/92  Pulse: 104 98 90  Temp: 98.8 F (37.1 C)  97.9 F (36.6 C)  TempSrc: Oral  Oral  Resp: Weight: 122.925 kg    SpO2: 100% 99% 98%    MDM  Vitals stable, afebrile Feels better after analgesia in ED. Normal neurological exam. No dizziness. Gait baseline. HA gradual in onset, progressively worsening. Headache and related symptoms similar to previous HA No hx of traumatic injury No hx of clotting disorder, peripartum or postpartum state, Immunocompromise Doubt Meningitis, CVA/SAH/ICH, Dural Venous Thrombosis, Eclampsia, Seizure, Central Lesion or Tumor, Giant Cell Arteritis, Cervical/Carotid or other vascular dissection. I have personally reviewed all labs, imaging, nursing/prvious notes during the patient's evaluation in the ED today. No evidence of other acute or emergent pathology that requires immediate intervention  at this time. Pt stable, in good condition and is appropriate for discharge. DC with HCTZ and instructions to follow up with PCP within 48 hrs for further evaluation and management of symptoms. Referral given for primary care  Final diagnoses:  Elevated blood pressure  Nonintractable headache, unspecified chronicity pattern, unspecified headache type      Joycie Peek, PA-C 12/01/15 1457  Nelva Nay, MD 12/02/15 [phone]

## 2015-12-09 ENCOUNTER — Emergency Department (HOSPITAL_BASED_OUTPATIENT_CLINIC_OR_DEPARTMENT_OTHER)
Admission: EM | Admit: 2015-12-09 | Discharge: 2015-12-09 | Disposition: A | Discharge status: Home / Self Care | Payer: Medicaid Other | Attending: Emergency Medicine | Admitting: Emergency Medicine

## 2015-12-09 ENCOUNTER — Encounter (HOSPITAL_BASED_OUTPATIENT_CLINIC_OR_DEPARTMENT_OTHER): Payer: Self-pay

## 2015-12-09 DIAGNOSIS — I10 Essential (primary) hypertension: Secondary | ICD-10-CM | POA: Insufficient documentation

## 2015-12-09 DIAGNOSIS — J020 Streptococcal pharyngitis: Secondary | ICD-10-CM | POA: Insufficient documentation

## 2015-12-09 DIAGNOSIS — J45909 Unspecified asthma, uncomplicated: Secondary | ICD-10-CM | POA: Insufficient documentation

## 2015-12-09 DIAGNOSIS — J029 Acute pharyngitis, unspecified: Secondary | ICD-10-CM | POA: Diagnosis present

## 2015-12-09 LAB — RAPID STREP SCREEN (MED CTR MEBANE ONLY): Streptococcus, Group A Screen (Direct): POSITIVE — AB

## 2015-12-09 MED ORDER — HYDROCODONE-ACETAMINOPHEN 7.5-325 MG/15ML PO SOLN: 15.0000 mL | Freq: Four times a day (QID) | ORAL | Status: AC | PRN

## 2015-12-09 MED ORDER — PENICILLIN G BENZATHINE 1200000 UNIT/2ML IM SUSP
1.2000 10*6.[IU] | Freq: Once | INTRAMUSCULAR | Status: AC
  Administered 2015-12-09: 1.2 10*6.[IU] via INTRAMUSCULAR
  Filled 2015-12-09: qty 2

## 2015-12-09 NOTE — ED Notes (Signed)
C/o sore throat since yesterday-NAD-steady gait

## 2015-12-09 NOTE — Discharge Instructions (Signed)
Take your medication as prescribed for pain relief. I recommend taking ibuprofen as prescribed as needed over-the-counter for pain relief. Continue drinking fluids to remain hydrated. You are contagious for up to 24 hours after getting your antibiotic shot tonight. Please follow up with a primary care provider from the Resource Guide provided below in 5 days. Please return to the Emergency Department if symptoms worsen or new onset of fever, facial/neck swelling, unable to tolerate fluids, vomiting, drooling.   Emergency Department Resource Guide 1) Find a Doctor and Pay Out of Pocket Although you won't have to find out who is covered by your insurance plan, it is a good idea to ask around and get recommendations. You will then need to call the office and see if the doctor you have chosen will accept you as a new patient and what types of options they offer for patients who are self-pay. Some doctors offer discounts or will set up payment plans for their patients who do not have insurance, but you will need to ask so you aren't surprised when you get to your appointment.  2) Contact Your Local Health Department Not all health departments have doctors that can see patients for sick visits, but many do, so it is worth a call to see if yours does. If you don't know where your local health department is, you can check in your phone book. The CDC also has a tool to help you locate your state's health department, and many state websites also have listings of all of their local health departments.  3) Find a Walk-in Clinic If your illness is not likely to be very severe or complicated, you may want to try a walk in clinic. These are popping up all over the country in pharmacies, drugstores, and shopping centers. They're usually staffed by nurse practitioners or physician assistants that have been trained to treat common illnesses and complaints. They're usually fairly quick and inexpensive. However, if you  have serious medical issues or chronic medical problems, these are probably not your best option.  No Primary Care Doctor: - Call Health Connect at  [phone] - they can help you locate a primary care doctor that  accepts your insurance, provides certain services, etc. - Physician Referral Service- [phone]  Chronic Pain Problems: Organization         Address  Phone   Notes  [facility]  [phone] Patients need to be referred by their primary care doctor.   Medication Assistance: Organization         Address  Phone   Notes  [facility] Medication [facility] [address] [place] [place], [place] [address] [phone] --Must be a resident of [facility] -- Must have NO insurance coverage whatsoever (no Medicaid/ Medicare, etc.) -- The pt. MUST have a primary care doctor that directs their care regularly and follows them in the community   [facility]  [phone]   [employer]  [phone]    Agencies that provide inexpensive medical care: Organization         Address  Phone   Notes  [facility]  [phone]   [facility]    [phone]   [facility] [address] [place], [place] [address] [phone]   [facility] [address] [place] [place] [phone]   [facility]anned Parenthood    [phone]   [facility]    [phone]   [facility]  [address], [place] Phone:  [phone], Fax:  [phone] Hours of Operation:  9 am - 6 pm, M-F.  Also accepts Medicaid/Medicare and self-pay.  [facility]  [address], [place] Phone: [phone], Fax: [phone]. Hours of Operation:  8:30 am - 5:30 pm, M-F.  Also accepts Medicaid and self-pay.  [facility] [place] [address], [place] Phone: [phone]   [facility] [address]  Natasha Bence [place], [place] [phone], Ext. [phone] Mondays & Thursdays: 7-9 AM.  First 15 patients are seen on a first come, first serve basis.    Medicaid-accepting [facility] Providers:  Organization         Address  Phone   Notes  [facility] [address], [place] [phone] Also accepts self-pay patients.  [facility] [address] Laurell Josephs [place], [place]  [phone]   [facility] [address], [place] [phone]   [facility] [address], [place] [phone]   Renaye Rakers [address], [place]   [phone] Only accepts [place] Access [place] patients after they have their name applied to their card.   Self-Pay (no insurance) in [facility]:  Organization         Address  Phone   Notes  Sickle Cell Patients, [facility] [address] [place], [place] [phone]   [facility] Care [address] [place], [place] [phone]   [facility] [place]  [address], [place] [phone]   [facility]/Dr. Osei-Bonsu  [address] [place] or [address], [place] [phone] Phone number for both [place] and [place] locations is the same.  [facility] [address] [place] [phone]   [facility] [address] [place] or [address] [phone]   [facility] [address] [place] [address] [place] [phone], phone; [phone], fax Sees patients 1st and 3rd Saturday of every month.  Must not qualify for public or private insurance (i.e. Medicaid, Medicare, [place] Health Choice, Veterans' Benefits)  Household income should be no more than 200% of the poverty level The clinic cannot treat you if you are pregnant or think you are pregnant   Sexually transmitted diseases are not treated at the clinic.    Dental Care: Organization         Address  Phone  Notes  [facility] of [facility] [address] [place], [place] [phone] Accepts children up to age 61 who are enrolled in [place] or [place] Health Choice; pregnant women with a Medicaid card; and children who have applied for Medicaid or [place] Health Choice, but were declined, whose parents can pay a reduced fee at time of service.  [facility]  [address], [place] [phone] Accepts children up to age 48 who are enrolled in [place] or [place] Health Choice; pregnant women with a Medicaid card; and children who have applied for Medicaid or [place] Health Choice, but were declined, whose parents can pay a reduced fee at time of service.  [facility]  [address] [place], [place] [phone] Patients are seen by appointment only. Walk-ins are not accepted. [facility] will see patients 41 years of age and older. Monday - Tuesday (8am-5pm) Most Wednesdays (8:30-5pm) $30 per visit, cash only  [facility]  [address], [facility] [phone] Patients are seen by appointment only. Walk-ins are not accepted. [facility] will see patients 46 years of age and older. One Wednesday Evening (Monthly: Volunteer Based).  $30 per visit, cash only  [employer]  [phone] for adults; Children under age 64, call Graduate Pediatric Dentistry at [phone]. Children aged 28-14, please call [phone] to request a pediatric application.  Dental services are provided in all areas of dental care including fillings, crowns and bridges, complete and partial dentures, implants, gum treatment, root canals, and extractions. Preventive care is also provided. Treatment is provided to both adults and children. Patients  are selected via a lottery and there is often a waiting list.   [facility] [address], [place]  [phone] [email or web address]   [facility] [address] [place], [place] [phone], Ext. [phone] Second and Fourth Thursday of each month, opens at 6:30 AM; Clinic ends at 9 AM.  Patients are seen on a first-come first-served basis, and a limited number are seen during each clinic.   [facility]  [address] Ether Griffins [place], [place] [phone]   Eligibility Requirements You must have lived in [place], [place], or [place] counties for at least the last three months.   You cannot be eligible for state or federal sponsored [place], including [employer], [place], or [employer].   You generally cannot be eligible for healthcare insurance through your employer.    How to apply: Eligibility screenings are held every Tuesday and Wednesday afternoon from 1:00 pm until 4:00 pm. You do not need an appointment for the interview!  [facility] [address] [place], [place] [phone]   [facility] Health Department  [phone]   [facility] Health Department  [phone]   [facility] Health Department  [phone]    Behavioral Health Resources in the Community: Intensive Outpatient Programs Organization         Address  Phone  Notes  [facility] [address] [place], [place] [phone]   [facility] Outpatient [address] [place], [place] [phone]   [facility] [address], [place], [place]  [phone]   [facility] Mental Health [address]  [place], [place] [phone] or [phone]   Substance Abuse Resources Organization         Address  Phone  Notes  [facility]  [phone]   [facility]  [phone]   [facility]  [phone]   Floydene Flock  [phone]    Residential & Outpatient Substance Abuse Program  [phone]   Psychological Services Organization         Address  Phone  Notes  [facility] Behavioral Health  [phone]   [facility]  [phone]   [facility] Mental [facility] [address], [place] [phone] or [phone]    Mobile Crisis Teams Organization         Address  Phone  Notes  [facility], Mobile Crisis Care Unit  [phone]   [facility]  [address] [place],  [place] [phone]   [facility] [address] [place] [phone]    Self-Help/Support Groups Organization         Address  Phone             Notes  [facility] [place] - variety of support groups  [phone]- I7437963 Call for more information  Narcotics Anonymous (NA), [facility] [address], [employer] [place]  2 meetings at this location   Statistician         Address  Phone  Notes  [facility] [address],    [place]  [phone]   [facility]  [address] [place] [address], [place], [place] [phone]   [facility] [address] [place], [place] [phone] Admissions: 8am-3pm M-F  [facility] [address],    [place], [place] [phone]   [facility] [address] [place], [place], [place] [phone]   [facility] [address],  [place], [place] [phone]   [facility] - Intensive Outpatient [address] Laurell Josephs [address], [place], [place] [phone]   [facility] ([facility]iction [facility] [address] [place]  [place], [place] [phone] or [phone]   [facility] ([facility]) [address] [place], [place] [phone] Accepts Medicaid  [facility] [address]  [place] [phone] Substance Abuse/Addiction Treatment   [facility] Organization         Address  Phone  Notes  [facility]  [phone]   Angie Fava, PhD [address] Ervin Knack [place], [place]   [phone] or [phone]   [facility]   [address] [place], [place] [phone]   [facility] [address] [place], [place] [phone] Insurance/Medicaid/sponsorship through [facility] [address]                                    [place], [place] [phone] Therapy/tele-psych/case  [facility] [address], [place] [phone]    Dr. Lolly Mustache  [phone]   [facility]  [employer] [facility] Dept. 1) [address] [place] 2) [address] [place] 3)  [address], [place] [phone]  [phone]   [facility] Child Abuse Hotline 587-856-5325 or [phone] (After Hours)

## 2015-12-09 NOTE — ED Provider Notes (Signed)
CSN: [id number]     Arrival date & time 12/09/15  1941 History   First MD Initiated Contact with Patient 12/09/15 2154     Chief Complaint  Patient presents with  . Sore Throat     (Consider location/radiation/quality/duration/timing/severity/associated sxs/prior Treatment) HPI   Patient is a 40 year old female past medical history of asthma and hypertension who presents to the ED with complaint of sore throat, onset 2 days. Patient reports having worsening sore throat over the past few days, denies any alleviating factors. She notes the pain is worse with swallowing. Endorses associated nasal congestion. She notes last night she had a fever which has since resolved after taking Tylenol at home. Denies chills, body aches, headache, cough, trismus, drooling, wheezing, shortness of breath, chest pain, nausea, vomiting, diarrhea, abdominal pain. 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  Past Medical History  Diagnosis Date  . Asthma   . Hypertension    Past Surgical History  Procedure Laterality Date  . Cesarean section    . Tubal ligation     No family history on file. Social History  Substance Use Topics  . Smoking status: Never Smoker   . Smokeless tobacco: None  . Alcohol Use: No   OB History    No data available     Review of Systems  Constitutional: Positive for fever.  HENT: Positive for congestion, sore throat and trouble swallowing (due to pain). Negative for drooling and facial swelling.   Respiratory: Negative for shortness of breath.   Cardiovascular: Negative for chest pain.  Gastrointestinal: Negative for nausea, vomiting, abdominal pain and diarrhea.      Allergies  Zolpidem tartrate  Home Medications   Prior to Admission medications   Medication Sig Start Date End Date Taking? Authorizing Provider  HYDROcodone-acetaminophen (HYCET) 7.5-325 mg/15 ml solution Take 15 mLs by mouth 4 (four) times daily as needed for moderate pain. 12/09/15  12/08/16  Satira Sark Nagi Furio, PA-C   BP 153/102 mmHg  Pulse 116  Temp(Src) 98.4 F (36.9 C) (Oral)  Resp 18  Ht  (1.6 m)  Wt 121.564 kg  BMI 47.49 kg/m2  SpO2 99%  LMP 11/23/2015 Physical Exam  Constitutional: She is oriented to person, place, and time. She appears well-developed and well-nourished.  HENT:  Head: Normocephalic and atraumatic.  Right Ear: Tympanic membrane normal.  Left Ear: Tympanic membrane normal.  Nose: Nose normal. Right sinus exhibits no maxillary sinus tenderness and no frontal sinus tenderness. Left sinus exhibits no maxillary sinus tenderness and no frontal sinus tenderness.  Mouth/Throat: Uvula is midline and mucous membranes are normal. Oropharyngeal exudate (white) and posterior oropharyngeal erythema present. No posterior oropharyngeal edema or tonsillar abscesses.  Eyes: Conjunctivae and EOM are normal. Right eye exhibits no discharge. Left eye exhibits no discharge. No scleral icterus.  Neck: Normal range of motion. Neck supple.  Cardiovascular: Normal rate, regular rhythm, normal heart sounds and intact distal pulses.   HR 98  Pulmonary/Chest: Effort normal and breath sounds normal. No respiratory distress. She has no wheezes. She has no rales. She exhibits no tenderness.  Lymphadenopathy:    She has cervical adenopathy (bilateral submandibular lymphadenopathy).  Neurological: She is alert and oriented to person, place, and time.  Skin: Skin is warm and dry.  Nursing note and vitals reviewed.   ED Course  Procedures (including critical care time) Labs Review Labs Reviewed  RAPID STREP SCREEN (NOT AT [facility]) - Abnormal; Notable for the following:    Streptococcus, Group  A Screen (Direct) POSITIVE (*)    All other components within normal limits    Imaging Review No results found. I have personally reviewed and evaluated these images and lab results as part of my medical decision-making.  Filed Vitals:   12/09/15 1956  BP: 153/102   Pulse: 116  Temp: 98.4 F (36.9 C)  Resp: 18     MDM   Final diagnoses:  Strep throat    Pt nonfebrile with tonsillar exudate, cervical lymphadenopathy, & dysphagia; diagnosis of strep. Treated in the Ed with PCN IM.  Pt does not appear dehydrated, but discussed importance of water hydration. Presentation non concerning for PTA or infxn spread to soft tissue. No trismus or uvula deviation. Specific return precautions discussed. Pt able to drink water in ED without difficulty with intact air way. Recommended PCP follow up.    Evaluation does not show pathology requring ongoing emergent intervention or admission. Pt is hemodynamically stable and mentating appropriately. Discussed findings/results and plan with patient/guardian, who agrees with plan. All questions answered. Return precautions discussed and outpatient follow up given.      Satira Sark [place], [place] 12/09/15 2238  Loren Racer, MD 12/09/15 [phone]

## 2016-08-12 ENCOUNTER — Encounter (HOSPITAL_COMMUNITY): Payer: Self-pay | Admitting: Emergency Medicine

## 2016-08-12 ENCOUNTER — Emergency Department (HOSPITAL_COMMUNITY): Payer: 59

## 2016-08-12 ENCOUNTER — Emergency Department (HOSPITAL_COMMUNITY)
Admission: EM | Admit: 2016-08-12 | Discharge: 2016-08-12 | Disposition: A | Discharge status: Home / Self Care | Payer: 59 | Attending: Emergency Medicine | Admitting: Emergency Medicine

## 2016-08-12 DIAGNOSIS — Z79899 Other long term (current) drug therapy: Secondary | ICD-10-CM | POA: Insufficient documentation

## 2016-08-12 DIAGNOSIS — R102 Pelvic and perineal pain: Secondary | ICD-10-CM

## 2016-08-12 DIAGNOSIS — J45909 Unspecified asthma, uncomplicated: Secondary | ICD-10-CM | POA: Diagnosis not present

## 2016-08-12 DIAGNOSIS — I10 Essential (primary) hypertension: Secondary | ICD-10-CM | POA: Insufficient documentation

## 2016-08-12 DIAGNOSIS — N83202 Unspecified ovarian cyst, left side: Secondary | ICD-10-CM | POA: Insufficient documentation

## 2016-08-12 DIAGNOSIS — R1032 Left lower quadrant pain: Secondary | ICD-10-CM | POA: Diagnosis present

## 2016-08-12 LAB — COMPREHENSIVE METABOLIC PANEL
ALBUMIN: 3.6 g/dL (ref 3.5–5.0)
ALT: 12 U/L — ABNORMAL LOW (ref 14–54)
ANION GAP: 10 (ref 5–15)
AST: 14 U/L — ABNORMAL LOW (ref 15–41)
Alkaline Phosphatase: 76 U/L (ref 38–126)
BUN: 13 mg/dL (ref 6–20)
CO2: 23 mmol/L (ref 22–32)
Calcium: 8.8 mg/dL — ABNORMAL LOW (ref 8.9–10.3)
Chloride: 103 mmol/L (ref 101–111)
Creatinine, Ser: 0.79 mg/dL (ref 0.44–1.00)
GFR calc Af Amer: >60 mL/min (ref >60)
GFR calc non Af Amer: >60 mL/min (ref >60)
GLUCOSE: 113 mg/dL — AB (ref 65–99)
POTASSIUM: 3.8 mmol/L (ref 3.5–5.1)
SODIUM: 136 mmol/L (ref 135–145)
TOTAL PROTEIN: 7.3 g/dL (ref 6.5–8.1)
Total Bilirubin: 0.3 mg/dL (ref 0.3–1.2)

## 2016-08-12 LAB — CBC
HCT: 33.4 % — ABNORMAL LOW (ref 36.0–46.0)
Hemoglobin: 10.1 g/dL — ABNORMAL LOW (ref 12.0–15.0)
MCH: 21 pg — ABNORMAL LOW (ref 26.0–34.0)
MCHC: 30.2 g/dL (ref 30.0–36.0)
MCV: 69.4 fL — ABNORMAL LOW (ref 78.0–100.0)
PLATELETS: 496 10*3/uL — AB (ref 150–400)
RBC: 4.81 MIL/uL (ref 3.87–5.11)
RDW: 14.9 % (ref 11.5–15.5)
WBC: 13 10*3/uL — AB (ref 4.0–10.5)

## 2016-08-12 LAB — I-STAT TROPONIN, ED: TROPONIN I, POC: 0.01 ng/mL (ref 0.00–0.08)

## 2016-08-12 LAB — URINALYSIS, ROUTINE W REFLEX MICROSCOPIC
Bilirubin Urine: NEGATIVE
GLUCOSE, UA: NEGATIVE mg/dL
Hgb urine dipstick: NEGATIVE
Ketones, ur: 15 mg/dL — AB
NITRITE: NEGATIVE
PROTEIN: NEGATIVE mg/dL
Specific Gravity, Urine: 1.013 (ref 1.005–1.030)
pH: 6.5 (ref 5.0–8.0)

## 2016-08-12 LAB — URINE MICROSCOPIC-ADD ON: RBC / HPF: NONE SEEN RBC/hpf (ref 0–5)

## 2016-08-12 LAB — LIPASE, BLOOD: LIPASE: 22 U/L (ref 11–51)

## 2016-08-12 LAB — CBG MONITORING, ED: Glucose-Capillary: 102 mg/dL — ABNORMAL HIGH (ref 65–99)

## 2016-08-12 LAB — I-STAT BETA HCG BLOOD, ED (MC, WL, AP ONLY)

## 2016-08-12 MED ORDER — OXYCODONE-ACETAMINOPHEN 5-325 MG PO TABS: 2.0000 | ORAL_TABLET | ORAL | 0 refills | Status: AC | PRN

## 2016-08-12 MED ORDER — IBUPROFEN 600 MG PO TABS: 600.0000 mg | ORAL_TABLET | Freq: Four times a day (QID) | ORAL | 0 refills | Status: DC | PRN

## 2016-08-12 NOTE — ED Provider Notes (Signed)
[facility]-EMERGENCY DEPT Provider Note   CSN: [id number] Arrival date & time: 08/12/16  1251     History   Chief Complaint Chief Complaint  Patient presents with  . Seizures  . Chest Pain  . Abdominal Pain  . Hematuria    HPI Ellen Marks is a 40 y.o. female.  Patient is a 40 year old female complains with sharp left lower quadrant abdominal pain which began this morning. Denies any fever or chills. No vomiting. No vaginal bleeding or discharge. Pain is been persistent and radiates into her groin. No prior history of same. Last menstrual period was 2 weeks ago. Denies any rashes to her flank area. Patient has remote history of possible seizure disorder and states that her first episode that was 2 months ago. Chest CT scan which according to her was negative. Was not given neurology follow-up and today she said when the pain started she laid on the ground and possibly had a seizure. She denies any bowel or bladder incontinence. No tongue injury. No postictal period. Patient also had some upper epigastric discomfort with no anginal or CHF type symptoms.      Past Medical History:  Diagnosis Date  . Asthma   . Hypertension     There are no active problems to display for this patient.   Past Surgical History:  Procedure Laterality Date  . CESAREAN SECTION    . TUBAL LIGATION      OB History    No data available       Home Medications    Prior to Admission medications   Medication Sig Start Date End Date Taking? Authorizing Provider  hydrochlorothiazide (HYDRODIURIL) 25 MG tablet Take 25 mg by mouth daily.   Yes Historical Provider, MD  HYDROcodone-acetaminophen (HYCET) 7.5-325 mg/15 ml solution Take 15 mLs by mouth 4 (four) times daily as needed for moderate pain. Patient not taking: Reported on 08/12/2016 12/09/15 12/08/16  Barrett Henle, PA-C    Family History History reviewed. No pertinent family history.  Social History Social History  Substance Use  Topics  . Smoking status: Never Smoker  . Smokeless tobacco: Never Used  . Alcohol use No     Allergies   Zolpidem tartrate   Review of Systems Review of Systems  All other systems reviewed and are negative.    Physical Exam Updated Vital Signs BP 160/78   Pulse 97   Temp 99.5 F (37.5 C) (Oral)   Resp 22   LMP 07/29/2016 (Exact Date)   SpO2 97%   Physical Exam  Constitutional: She is oriented to person, place, and time. She appears well-developed and well-nourished.  Non-toxic appearance. No distress.  HENT:  Head: Normocephalic and atraumatic.  Eyes: Conjunctivae, EOM and lids are normal. Pupils are equal, round, and reactive to light.  Neck: Normal range of motion. Neck supple. No tracheal deviation present. No thyroid mass present.  Cardiovascular: Normal rate, regular rhythm and normal heart sounds.  Exam reveals no gallop.   No murmur heard. Pulmonary/Chest: Effort normal and breath sounds normal. No stridor. No respiratory distress. She has no decreased breath sounds. She has no wheezes. She has no rhonchi. She has no rales.  Abdominal: Soft. Normal appearance and bowel sounds are normal. She exhibits no distension. There is tenderness in the left lower quadrant. There is no rigidity, no rebound, no guarding and no CVA tenderness.  Musculoskeletal: Normal range of motion. She exhibits no edema or tenderness.  Neurological: She is alert and oriented to person,  place, and time. She has normal strength. No cranial nerve deficit or sensory deficit. GCS eye subscore is 4. GCS verbal subscore is 5. GCS motor subscore is 6.  Skin: Skin is warm and dry. No abrasion and no rash noted.  Psychiatric: She has a normal mood and affect. Her speech is normal and behavior is normal.  Nursing note and vitals reviewed.    ED Treatments / Results  Labs (all labs ordered are listed, but only abnormal results are displayed) Labs Reviewed  CBC - Abnormal; Notable for the following:        Result Value   WBC 13.0 (*)    Hemoglobin 10.1 (*)    HCT 33.4 (*)    MCV 69.4 (*)    MCH 21.0 (*)    Platelets 496 (*)    All other components within normal limits  COMPREHENSIVE METABOLIC PANEL - Abnormal; Notable for the following:    Glucose, Bld 113 (*)    Calcium 8.8 (*)    AST 14 (*)    ALT 12 (*)    All other components within normal limits  URINALYSIS, ROUTINE W REFLEX MICROSCOPIC (NOT AT [facility]) - Abnormal; Notable for the following:    APPearance CLOUDY (*)    Ketones, ur 15 (*)    Leukocytes, UA MODERATE (*)    All other components within normal limits  URINE MICROSCOPIC-ADD ON - Abnormal; Notable for the following:    Squamous Epithelial / LPF 6-30 (*)    Bacteria, UA FEW (*)    All other components within normal limits  CBG MONITORING, ED - Abnormal; Notable for the following:    Glucose-Capillary 102 (*)    All other components within normal limits  LIPASE, BLOOD  I-STAT TROPOININ, ED  I-STAT BETA HCG BLOOD, ED ([facility], [facility], [facility] ONLY)    EKG  EKG Interpretation  Date/Time:  Friday August 12 2016 12:57:38 EDT Ventricular Rate:  106 PR Interval:    QRS Duration: 81 QT Interval:  291 QTC Calculation: 387 R Axis:   61 Text Interpretation:  Sinus tachycardia Borderline repolarization abnormality Confirmed by Freida Busman  MD, Geraldyn Shain ([id number]) on 08/12/2016 1:21:24 PM       Radiology US Transvaginal Non-ob  Result Date: 08/12/2016 CLINICAL DATA:  Pelvic pain in a female EXAM: TRANSABDOMINAL AND TRANSVAGINAL ULTRASOUND OF PELVIS DOPPLER ULTRASOUND OF OVARIES TECHNIQUE: Both transabdominal and transvaginal ultrasound examinations of the pelvis were performed. Transabdominal technique was performed for global imaging of the pelvis including uterus, ovaries, adnexal regions, and pelvic cul-de-sac. It was necessary to proceed with endovaginal exam following the transabdominal exam to visualize the endometrium and ovaries. Color and duplex Doppler ultrasound was utilized  to evaluate blood flow to the ovaries. COMPARISON:  None. FINDINGS: Uterus Measurements: 16 x 8 x 12 cm. There is a heterogeneous partly hyperechoic mass measuring up to 9 cm, centered in the intramural uterus but distorting and obscuring the endometrium. Endometrium Not visible due to obscuration by fibroid. Right ovary Measurements: 37 x 23 x 23 mm. Normal appearance/no adnexal mass. Left ovary Measurements: 44 x 21 x 30 mm. There is an para ovarian simple appearing 6.6 cm cyst. The ovaries were only visible transabdominally due to patient's body habitus. Venous blood flow at least is seen in the bilateral ovary. Other findings No abnormal free fluid. IMPRESSION: 1. Limited scan due to body habitus without acute finding. Doppler evaluation of the ovaries is particularly limited but both ovaries have blood flow and there is no  secondary sign of torsion. 2. 6.6 cm left adnexal cyst with simple appearance. This is almost certainly benign, but follow up ultrasound is recommended in 1 year according to the Society of Radiologists in Ultrasound2010 Consensus Conference Statement (D Lenis Noon et al. Management of Asymptomatic Ovarian and Other Adnexal Cysts Imaged at [place]: Society of Radiologists in Ultrasound Consensus Conference Statement 2010. Radiology 256 (Sept 2010): 943-954.). 3. Enlarged uterus with nearly 9 cm fibroid. The fibroid distorts and obscures the endometrium. Electronically Signed   By: Marnee Spring M.D.   On: 08/12/2016 15:53   US Pelvis Complete  Result Date: 08/12/2016 CLINICAL DATA:  Pelvic pain in a female EXAM: TRANSABDOMINAL AND TRANSVAGINAL ULTRASOUND OF PELVIS DOPPLER ULTRASOUND OF OVARIES TECHNIQUE: Both transabdominal and transvaginal ultrasound examinations of the pelvis were performed. Transabdominal technique was performed for global imaging of the pelvis including uterus, ovaries, adnexal regions, and pelvic cul-de-sac. It was necessary to proceed with endovaginal exam following the  transabdominal exam to visualize the endometrium and ovaries. Color and duplex Doppler ultrasound was utilized to evaluate blood flow to the ovaries. COMPARISON:  None. FINDINGS: Uterus Measurements: 16 x 8 x 12 cm. There is a heterogeneous partly hyperechoic mass measuring up to 9 cm, centered in the intramural uterus but distorting and obscuring the endometrium. Endometrium Not visible due to obscuration by fibroid. Right ovary Measurements: 37 x 23 x 23 mm. Normal appearance/no adnexal mass. Left ovary Measurements: 44 x 21 x 30 mm. There is an para ovarian simple appearing 6.6 cm cyst. The ovaries were only visible transabdominally due to patient's body habitus. Venous blood flow at least is seen in the bilateral ovary. Other findings No abnormal free fluid. IMPRESSION: 1. Limited scan due to body habitus without acute finding. Doppler evaluation of the ovaries is particularly limited but both ovaries have blood flow and there is no secondary sign of torsion. 2. 6.6 cm left adnexal cyst with simple appearance. This is almost certainly benign, but follow up ultrasound is recommended in 1 year according to the Society of Radiologists in Ultrasound2010 Consensus Conference Statement (D Lenis Noon et al. Management of Asymptomatic Ovarian and Other Adnexal Cysts Imaged at [place]: Society of Radiologists in Ultrasound Consensus Conference Statement 2010. Radiology 256 (Sept 2010): 943-954.). 3. Enlarged uterus with nearly 9 cm fibroid. The fibroid distorts and obscures the endometrium. Electronically Signed   By: Marnee Spring M.D.   On: 08/12/2016 15:53   [place] Art/ven Flow Abd Pelv Doppler  Result Date: 08/12/2016 CLINICAL DATA:  Pelvic pain in a female EXAM: TRANSABDOMINAL AND TRANSVAGINAL ULTRASOUND OF PELVIS DOPPLER ULTRASOUND OF OVARIES TECHNIQUE: Both transabdominal and transvaginal ultrasound examinations of the pelvis were performed. Transabdominal technique was performed for global imaging of the pelvis  including uterus, ovaries, adnexal regions, and pelvic cul-de-sac. It was necessary to proceed with endovaginal exam following the transabdominal exam to visualize the endometrium and ovaries. Color and duplex Doppler ultrasound was utilized to evaluate blood flow to the ovaries. COMPARISON:  None. FINDINGS: Uterus Measurements: 16 x 8 x 12 cm. There is a heterogeneous partly hyperechoic mass measuring up to 9 cm, centered in the intramural uterus but distorting and obscuring the endometrium. Endometrium Not visible due to obscuration by fibroid. Right ovary Measurements: 37 x 23 x 23 mm. Normal appearance/no adnexal mass. Left ovary Measurements: 44 x 21 x 30 mm. There is an para ovarian simple appearing 6.6 cm cyst. The ovaries were only visible transabdominally due to patient's body habitus. Venous blood flow at least  is seen in the bilateral ovary. Other findings No abnormal free fluid. IMPRESSION: 1. Limited scan due to body habitus without acute finding. Doppler evaluation of the ovaries is particularly limited but both ovaries have blood flow and there is no secondary sign of torsion. 2. 6.6 cm left adnexal cyst with simple appearance. This is almost certainly benign, but follow up ultrasound is recommended in 1 year according to the Society of Radiologists in Ultrasound2010 Consensus Conference Statement (D Lenis NoonLevine et al. Management of Asymptomatic Ovarian and Other Adnexal Cysts Imaged at [place]: Society of Radiologists in Ultrasound Consensus Conference Statement 2010. Radiology 256 (Sept 2010): 943-954.). 3. Enlarged uterus with nearly 9 cm fibroid. The fibroid distorts and obscures the endometrium. Electronically Signed   By: Marnee SpringJonathon  Watts M.D.   On: 08/12/2016 15:53    Procedures Procedures (including critical care time)  Medications Ordered in ED Medications - No data to display   Initial Impression / Assessment and Plan / ED Course  I have reviewed the triage vital signs and the nursing  notes.  Pertinent labs & imaging results that were available during my care of the patient were reviewed by me and considered in my medical decision making (see chart for details).  Clinical Course   He has no neurological findings at this time. Unclear if she actually had a seizure. Patient's epigastric discomfort atypical for ACS. Pelvic ultrasound shows ovarian cyst. Patient to follow-up with her doctor.  Final Clinical Impressions(s) / ED Diagnoses   Final diagnoses:  Pelvic pain in female    New Prescriptions New Prescriptions   No medications on file     Lorre NickAnthony Mylen Mangan, MD 08/12/16 1609

## 2016-08-12 NOTE — ED Notes (Signed)
Patient transported to Ultrasound 

## 2016-08-12 NOTE — ED Triage Notes (Addendum)
Pt from work via [employer] with multiple complaints.  Initial call is for a full body seizure like activity lasting approx 5 mins.  No head injury as pt was able to lower herself to the ground prior to episode.  Pt reports hx of the same in the past but did not follow up with PCP or neurologist.  Eyes were open on EMS arrival but pt did not speak until she was on the EMS truck at which time she was A&Ox4. Pt additionally reports she had sharp CP under her left breast radiating to her back with diaphoresis, N/V starting today with the same happening approx 2 weeks ago.  Pt reporting sharp LLQ pain starting last night with hematuria noted this morning.  Given 324 mg aspirin and 4 mg Zofran.  NAD, A&Ox4.

## 2016-12-03 ENCOUNTER — Encounter (HOSPITAL_BASED_OUTPATIENT_CLINIC_OR_DEPARTMENT_OTHER): Payer: Self-pay | Admitting: *Deleted

## 2016-12-03 ENCOUNTER — Emergency Department (HOSPITAL_BASED_OUTPATIENT_CLINIC_OR_DEPARTMENT_OTHER)
Admission: EM | Admit: 2016-12-03 | Discharge: 2016-12-03 | Disposition: A | Discharge status: Home / Self Care | Payer: 59 | Attending: Emergency Medicine | Admitting: Emergency Medicine

## 2016-12-03 DIAGNOSIS — Z79899 Other long term (current) drug therapy: Secondary | ICD-10-CM | POA: Diagnosis not present

## 2016-12-03 DIAGNOSIS — J45901 Unspecified asthma with (acute) exacerbation: Secondary | ICD-10-CM | POA: Diagnosis not present

## 2016-12-03 DIAGNOSIS — J069 Acute upper respiratory infection, unspecified: Secondary | ICD-10-CM | POA: Insufficient documentation

## 2016-12-03 DIAGNOSIS — J040 Acute laryngitis: Secondary | ICD-10-CM

## 2016-12-03 DIAGNOSIS — I10 Essential (primary) hypertension: Secondary | ICD-10-CM | POA: Diagnosis not present

## 2016-12-03 DIAGNOSIS — R0602 Shortness of breath: Secondary | ICD-10-CM | POA: Diagnosis present

## 2016-12-03 HISTORY — DX: Unspecified convulsions: R56.9

## 2016-12-03 MED ORDER — IPRATROPIUM-ALBUTEROL 0.5-2.5 (3) MG/3ML IN SOLN
3.0000 mL | Freq: Four times a day (QID) | RESPIRATORY_TRACT | Status: DC
  Administered 2016-12-03: 3 mL via RESPIRATORY_TRACT
  Filled 2016-12-03: qty 3

## 2016-12-03 MED ORDER — PREDNISONE 50 MG PO TABS
60.0000 mg | ORAL_TABLET | Freq: Once | ORAL | Status: AC
  Administered 2016-12-03: 60 mg via ORAL
  Filled 2016-12-03: qty 1

## 2016-12-03 MED ORDER — BENZONATATE 100 MG PO CAPS: 100.0000 mg | ORAL_CAPSULE | Freq: Three times a day (TID) | ORAL | 0 refills | Status: AC | PRN

## 2016-12-03 MED ORDER — PREDNISONE 10 MG PO TABS: 40.0000 mg | ORAL_TABLET | Freq: Every day | ORAL | 0 refills | Status: AC

## 2016-12-03 MED ORDER — ALBUTEROL SULFATE HFA 108 (90 BASE) MCG/ACT IN AERS
2.0000 | INHALATION_SPRAY | Freq: Once | RESPIRATORY_TRACT | Status: AC
  Administered 2016-12-03: 2 via RESPIRATORY_TRACT
  Filled 2016-12-03: qty 6.7

## 2016-12-03 NOTE — ED Notes (Signed)
ED Provider at bedside. 

## 2016-12-03 NOTE — ED Notes (Signed)
Patient assessed in triage. BBS clear, but slightly diminished in her bases. Patient able to speak in complete sentences, no acute distress noted. SAT 99%. Stated she did not take an MDI at home because she was out of medication. Will assess again when she gets a room assignment

## 2016-12-03 NOTE — ED Notes (Signed)
Patient given MDI at this time to take home. Stated she did not need a dose now, and also declined spacer.

## 2016-12-03 NOTE — ED Triage Notes (Signed)
Patient states she has a history of asthma.  Recently has had some cold symptoms.  This morning she woke up with sob, tightness in the chest and laryngitis.

## 2016-12-03 NOTE — ED Provider Notes (Signed)
[facility]   CSN: [id number] Arrival date & time: 12/03/16  1018     History   Chief Complaint Chief Complaint  Patient presents with  . Shortness of Breath    HPI Ellen Marks is a 41 y.o. female.  HPI   Hx of asthma, developed congestion, cough, had fever earlier in the week but has resolved fever now.  Today had shortness of breath, wheezing and loss of voice. Reports symptoms consistent with prior asthma exacerbations, however she is out of albuterol.  No chest pain.  No hx of hospitalization for asthma.  Past Medical History:  Diagnosis Date  . Asthma   . Hypertension   . Seizures (HCC)    x 2.     There are no active problems to display for this patient.   Past Surgical History:  Procedure Laterality Date  . CESAREAN SECTION    . TUBAL LIGATION      OB History    No data available       Home Medications    Prior to Admission medications   Medication Sig Start Date End Date Taking? Authorizing Provider  ibuprofen (ADVIL,MOTRIN) 600 MG tablet Take 1 tablet (600 mg total) by mouth every 6 (six) hours as needed. 08/12/16  Yes Lorre Nick, MD  benzonatate (TESSALON) 100 MG capsule Take 1 capsule (100 mg total) by mouth 3 (three) times daily as needed for cough. 12/03/16   Alvira Monday, MD  hydrochlorothiazide (HYDRODIURIL) 25 MG tablet Take 25 mg by mouth daily.    Historical Provider, MD  HYDROcodone-acetaminophen (HYCET) 7.5-325 mg/15 ml solution Take 15 mLs by mouth 4 (four) times daily as needed for moderate pain. Patient not taking: Reported on 08/12/2016 12/09/15 12/08/16  Barrett Henle, PA-C  METOPROLOL TARTRATE PO Take by mouth.    Historical Provider, MD  oxyCODONE-acetaminophen (PERCOCET/ROXICET) 5-325 MG tablet Take 2 tablets by mouth every 4 (four) hours as needed for severe pain. 08/12/16   Lorre Nick, MD  predniSONE (DELTASONE) 10 MG tablet Take 4 tablets (40 mg total) by mouth daily. 12/03/16 12/07/16   Alvira Monday, MD    Family History No family history on file.  Social History Social History  Substance Use Topics  . Smoking status: Never Smoker  . Smokeless tobacco: Never Used  . Alcohol use No     Allergies   Zolpidem tartrate   Review of Systems Review of Systems  Constitutional: Negative for fever (resolved).  HENT: Positive for voice change. Negative for sore throat (just with cough).   Eyes: Negative for visual disturbance.  Respiratory: Positive for cough and wheezing.   Cardiovascular: Negative for chest pain.  Gastrointestinal: Negative for abdominal pain, nausea and vomiting.  Genitourinary: Negative for difficulty urinating.  Musculoskeletal: Negative for back pain and neck pain.  Skin: Negative for rash.  Neurological: Negative for syncope and headaches.     Physical Exam Updated Vital Signs BP 158/97 (BP Location: Right Arm)   Pulse 100   Temp 98.1 F (36.7 C) (Oral)   Resp 18   Ht 5\' 3"  (1.6 m)   Wt 200 lb (90.7 kg)   LMP 11/26/2016   SpO2 99%   BMI 35.43 kg/m   Physical Exam  Constitutional: She is oriented to person, place, and time. She appears well-developed and well-nourished. No distress.  Hoarse/whisper voice  HENT:  Head: Normocephalic and atraumatic.  Mouth/Throat: Oropharynx is clear and moist. No oropharyngeal exudate.  Eyes: Conjunctivae and EOM are  normal. Pupils are equal, round, and reactive to light.  Neck: Normal range of motion.  Cardiovascular: Normal rate, regular rhythm, normal heart sounds and intact distal pulses.  Exam reveals no gallop and no friction rub.   No murmur heard. Pulmonary/Chest: Effort normal. No respiratory distress. She has wheezes (dimished bases, occasional wheeze). She has no rales.  Abdominal: Soft. She exhibits no distension. There is no tenderness. There is no guarding.  Musculoskeletal: She exhibits no edema or tenderness.  Neurological: She is alert and oriented to person, place, and time.   Skin: Skin is warm and dry. No rash noted. She is not diaphoretic. No erythema.  Nursing note and vitals reviewed.    ED Treatments / Results  Labs (all labs ordered are listed, but only abnormal results are displayed) Labs Reviewed - No data to display  EKG  EKG Interpretation None       Radiology No results found.  Procedures Procedures (including critical care time)  Medications Ordered in ED Medications  ipratropium-albuterol (DUONEB) 0.5-2.5 (3) MG/3ML nebulizer solution 3 mL (3 mLs Nebulization Given 12/03/16 1052)  predniSONE (DELTASONE) tablet 60 mg (60 mg Oral Given 12/03/16 1059)     Initial Impression / Assessment and Plan / ED Course  I have reviewed the triage vital signs and the nursing notes.  Pertinent labs & imaging results that were available during my care of the patient were reviewed by me and considered in my medical decision making (see chart for details).     41yo female with hx of asthma presents with cough, congestion, wheezing.  Patient with signs of laryngitis on exam. No sign of RPA, PTA, epiglottitis.  Symptoms consistent with prior asthma and URI.  Doubt pneumonia given no fevers, breath sounds clear bilaterally.  Given prednisone, duoneb with improvement. Given rx for prednisone, and albuterol MDI. Patient discharged in stable condition with understanding of reasons to return.   Final Clinical Impressions(s) / ED Diagnoses   Final diagnoses:  Mild asthma with exacerbation, unspecified whether persistent  Upper respiratory tract infection, unspecified type  Laryngitis    New Prescriptions New Prescriptions   BENZONATATE (TESSALON) 100 MG CAPSULE    Take 1 capsule (100 mg total) by mouth 3 (three) times daily as needed for cough.   PREDNISONE (DELTASONE) 10 MG TABLET    Take 4 tablets (40 mg total) by mouth daily.     Alvira MondayErin Jerusalen Mateja, MD 12/03/16 2224

## 2017-08-27 ENCOUNTER — Encounter (HOSPITAL_BASED_OUTPATIENT_CLINIC_OR_DEPARTMENT_OTHER): Payer: Self-pay | Admitting: *Deleted

## 2017-08-27 ENCOUNTER — Other Ambulatory Visit: Payer: Self-pay

## 2017-08-27 ENCOUNTER — Emergency Department (HOSPITAL_BASED_OUTPATIENT_CLINIC_OR_DEPARTMENT_OTHER)
Admission: EM | Admit: 2017-08-27 | Discharge: 2017-08-27 | Disposition: A | Discharge status: Home / Self Care | Payer: 59 | Attending: Emergency Medicine | Admitting: Emergency Medicine

## 2017-08-27 ENCOUNTER — Emergency Department (HOSPITAL_BASED_OUTPATIENT_CLINIC_OR_DEPARTMENT_OTHER): Payer: 59

## 2017-08-27 DIAGNOSIS — W19XXXA Unspecified fall, initial encounter: Secondary | ICD-10-CM | POA: Insufficient documentation

## 2017-08-27 DIAGNOSIS — Y929 Unspecified place or not applicable: Secondary | ICD-10-CM | POA: Diagnosis not present

## 2017-08-27 DIAGNOSIS — J45909 Unspecified asthma, uncomplicated: Secondary | ICD-10-CM | POA: Insufficient documentation

## 2017-08-27 DIAGNOSIS — S8992XA Unspecified injury of left lower leg, initial encounter: Secondary | ICD-10-CM | POA: Diagnosis present

## 2017-08-27 DIAGNOSIS — S80212A Abrasion, left knee, initial encounter: Secondary | ICD-10-CM | POA: Diagnosis not present

## 2017-08-27 DIAGNOSIS — M25662 Stiffness of left knee, not elsewhere classified: Secondary | ICD-10-CM | POA: Diagnosis not present

## 2017-08-27 DIAGNOSIS — Y939 Activity, unspecified: Secondary | ICD-10-CM | POA: Insufficient documentation

## 2017-08-27 DIAGNOSIS — I10 Essential (primary) hypertension: Secondary | ICD-10-CM | POA: Diagnosis not present

## 2017-08-27 DIAGNOSIS — Z79899 Other long term (current) drug therapy: Secondary | ICD-10-CM | POA: Diagnosis not present

## 2017-08-27 DIAGNOSIS — Y998 Other external cause status: Secondary | ICD-10-CM | POA: Diagnosis not present

## 2017-08-27 DIAGNOSIS — S8002XA Contusion of left knee, initial encounter: Secondary | ICD-10-CM | POA: Insufficient documentation

## 2017-08-27 MED ORDER — HYDROCODONE-ACETAMINOPHEN 5-325 MG PO TABS
1.0000 | ORAL_TABLET | Freq: Once | ORAL | Status: AC
  Administered 2017-08-27: 1 via ORAL
  Filled 2017-08-27: qty 1

## 2017-08-27 MED ORDER — IBUPROFEN 600 MG PO TABS
600.0000 mg | ORAL_TABLET | Freq: Four times a day (QID) | ORAL | 0 refills | Status: AC | PRN
Start: 2017-08-27 — End: ?

## 2017-08-27 MED ORDER — ACETAMINOPHEN 500 MG PO TABS: 1000.0000 mg | ORAL_TABLET | Freq: Three times a day (TID) | ORAL | 0 refills | Status: AC

## 2017-08-27 NOTE — ED Triage Notes (Signed)
Pt reports that her right leg 'gave out' and she fell onto her left knee.  Noted to have an abrasion and deformity to left knee.  Pulses intact.

## 2017-08-27 NOTE — ED Provider Notes (Signed)
[facility] EMERGENCY DEPARTMENT Provider Note  CSN: [id number] Arrival date & time: 08/27/17 1452  Chief Complaint(s) Knee Injury  HPI Ellen Marks is a 41 y.o. female who presents to the ED with left knee pain following a mechanical fall 2 hours prior to arrival.  Patient reports that her right leg gave out on her causing her to fall and landed on her left knee.  Patient had small amount of pain initially however gradually worsened over several minutes.  She was unable to bear weight on her left lower extremity.  Patient is endorsing mild loss of sensation distal to the left knee.  Pain is exacerbated with ambulation, palpation or range of motion of the left knee.  The pain is alleviated by immobilization.  No other alleviating or aggravating factors.  Patient denies any other injuries or physical complaints.  HPI  Past Medical History Past Medical History:  Diagnosis Date  . Asthma   . Hypertension   . Seizures (HCC)    x 2.    There are no active problems to display for this patient.  Home Medication(s) Prior to Admission medications   Medication Sig Start Date End Date Taking? Authorizing Provider  acetaminophen (TYLENOL) 500 MG tablet Take 2 tablets (1,000 mg total) every 8 (eight) hours for 5 days by mouth. Do not take more than 4000 mg of acetaminophen (Tylenol) in a 24-hour period. Please note that other medicines that you may be prescribed may have Tylenol as well. 08/27/17 09/01/17  Nira Connardama, Krystale Rinkenberger Eduardo, MD  benzonatate (TESSALON) 100 MG capsule Take 1 capsule (100 mg total) by mouth 3 (three) times daily as needed for cough. 12/03/16   Alvira MondaySchlossman, Erin, MD  hydrochlorothiazide (HYDRODIURIL) 25 MG tablet Take 25 mg by mouth daily.    [provider]  ibuprofen (ADVIL,MOTRIN) 600 MG tablet Take 1 tablet (600 mg total) every 6 (six) hours as needed by mouth. 08/27/17   Nira Connardama, Kelyse Pask Eduardo, MD  METOPROLOL TARTRATE PO Take by mouth.    [provider]    oxyCODONE-acetaminophen (PERCOCET/ROXICET) 5-325 MG tablet Take 2 tablets by mouth every 4 (four) hours as needed for severe pain. 08/12/16   Lorre NickAllen, Anthony, MD                                                                                                                                    Past Surgical History Past Surgical History:  Procedure Laterality Date  . CESAREAN SECTION    . TUBAL LIGATION     Family History History reviewed. No pertinent family history.  Social History Social History   Tobacco Use  . Smoking status: Never Smoker  . Smokeless tobacco: Never Used  Substance Use Topics  . Alcohol use: No  . Drug use: No   Allergies Zolpidem tartrate  Review of Systems Review of Systems All other systems are reviewed and are negative for acute change except as  noted in the HPI  Physical Exam Vital Signs  I have reviewed the triage vital signs BP (!) 179/106 (BP Location: Right Arm)   Pulse (!) 113   Temp 98.5 F (36.9 C) (Oral)   Ht 5\' 3"  (1.6 m)   Wt 86.2 kg (190 lb)   LMP 08/07/2017   SpO2 100%   BMI 33.66 kg/m   Physical Exam  Constitutional: She is oriented to person, place, and time. She appears well-developed and well-nourished. No distress.  HENT:  Head: Normocephalic and atraumatic.  Right Ear: External ear normal.  Left Ear: External ear normal.  Nose: Nose normal.  Eyes: Conjunctivae and EOM are normal. No scleral icterus.  Neck: Normal range of motion and phonation normal.  Cardiovascular: Normal rate and regular rhythm.  Pulses:      Dorsalis pedis pulses are 2+ on the right side, and 2+ on the left side.       Posterior tibial pulses are 2+ on the right side, and 2+ on the left side.  Pulmonary/Chest: Effort normal. No stridor. No respiratory distress.  Abdominal: She exhibits no distension.  Musculoskeletal: She exhibits no edema.       Left knee: She exhibits decreased range of motion (due to pain). She exhibits no deformity, no  laceration and no erythema. Tenderness found. Medial joint line and lateral joint line tenderness noted.       Legs: Neurological: She is alert and oriented to person, place, and time. She has normal strength.  Skin: She is not diaphoretic.  Psychiatric: She has a normal mood and affect. Her behavior is normal.  Vitals reviewed.   ED Results and Treatments Labs (all labs ordered are listed, but only abnormal results are displayed) Labs Reviewed - No data to display                                                                                                                       EKG  EKG Interpretation  Date/Time:    Ventricular Rate:    PR Interval:    QRS Duration:   QT Interval:    QTC Calculation:   R Axis:     Text Interpretation:        Radiology Dg Knee Complete 4 Views Left  Result Date: 08/27/2017 CLINICAL DATA:  Fall on concrete 1 hour ago. Abrasion to knee. Pain. EXAM: LEFT KNEE - COMPLETE 4+ VIEW COMPARISON:  None. FINDINGS: Mild degenerative changes are noted in the medial compartment of the knee. The knee is located. No acute fractures present. There is no significant effusion. IMPRESSION: 1. Degenerative changes within the medial compartment of the knee. 2. No acute abnormality. Electronically Signed   By: Marin Roberts M.D.   On: 08/27/2017 15:58   Pertinent labs & imaging results that were available during my care of the patient were reviewed by me and considered in my medical decision making (see chart for details).  Medications Ordered in ED Medications  HYDROcodone-acetaminophen (NORCO/VICODIN) 5-325  MG per tablet 1 tablet (1 tablet Oral Given 08/27/17 1608)                                                                                                                                    Procedures Procedures  (including critical care time)  Medical Decision Making / ED Course I have reviewed the nursing notes for this encounter and the patient's  prior records (if available in EHR or on provided paperwork).    Mechanical fall resulting in left knee injury.  Plain film negative for any acute dislocation or fractures.  Patient has intact pulses, it is unlikely that there is any vascular injury.  Patient likely has neuropraxia from local swelling causing loss of sensation.  Patient has intact strength.  Provided with oral pain medication and knee immobilizer.  Recommend close follow-up with PCP and orthopedic surgery if pain persists.  The patient is safe for discharge with strict return precautions.   Final Clinical Impression(s) / ED Diagnoses Final diagnoses:  Injury of left knee, initial encounter  Contusion of left knee, initial encounter   Disposition: Discharge  Condition: Good  I have discussed the results, Dx and Tx plan with the patient who expressed understanding and agree(s) with the plan. Discharge instructions discussed at great length. The patient was given strict return precautions who verbalized understanding of the instructions. No further questions at time of discharge.    This SmartLink is deprecated. Use AVSMEDLIST instead to display the medication list for a patient.  Follow Up: Primary care provider  In 1 week If symptoms do not improve or  worsen  Venita Lick, MD [address] [place] [address] [phone]  Schedule an appointment as soon as possible for a visit in 2 weeks If symptoms do not improve or  worsen       This chart was dictated using voice recognition software.  Despite best efforts to proofread,  errors can occur which can change the documentation meaning.   Nira Conn, MD 08/27/17 [phone]

## 2018-11-04 ENCOUNTER — Encounter (HOSPITAL_BASED_OUTPATIENT_CLINIC_OR_DEPARTMENT_OTHER): Payer: Self-pay | Admitting: Emergency Medicine

## 2018-11-04 ENCOUNTER — Emergency Department (HOSPITAL_BASED_OUTPATIENT_CLINIC_OR_DEPARTMENT_OTHER)
Admission: EM | Admit: 2018-11-04 | Discharge: 2018-11-04 | Disposition: A | Discharge status: Home / Self Care | Payer: 59 | Attending: Emergency Medicine | Admitting: Emergency Medicine

## 2018-11-04 ENCOUNTER — Other Ambulatory Visit: Payer: Self-pay

## 2018-11-04 DIAGNOSIS — R519 Headache, unspecified: Secondary | ICD-10-CM

## 2018-11-04 DIAGNOSIS — Z79899 Other long term (current) drug therapy: Secondary | ICD-10-CM | POA: Insufficient documentation

## 2018-11-04 DIAGNOSIS — R51 Headache: Secondary | ICD-10-CM | POA: Diagnosis not present

## 2018-11-04 DIAGNOSIS — I10 Essential (primary) hypertension: Secondary | ICD-10-CM

## 2018-11-04 DIAGNOSIS — J45909 Unspecified asthma, uncomplicated: Secondary | ICD-10-CM | POA: Diagnosis not present

## 2018-11-04 MED ORDER — KETOROLAC TROMETHAMINE 15 MG/ML IJ SOLN
15.0000 mg | Freq: Once | INTRAMUSCULAR | Status: AC
  Administered 2018-11-04: 15 mg via INTRAMUSCULAR
  Filled 2018-11-04: qty 1

## 2018-11-04 MED ORDER — LISINOPRIL-HYDROCHLOROTHIAZIDE 10-12.5 MG PO TABS: 1.0000 | ORAL_TABLET | Freq: Every day | ORAL | 0 refills | Status: DC

## 2018-11-04 NOTE — ED Triage Notes (Signed)
Pt reports headache and BP189/113 at home. Pt has been out of BP meds since Friday

## 2018-11-04 NOTE — Discharge Instructions (Signed)
You may try nasal saline spray to help with nasal congestion. Continue taking allergy medicine as prescribed. Take your blood pressure medicine daily as prescribed.  Follow-up with your primary care doctor for further management and refills of your blood pressure medicine. Use Tylenol as needed for pain.  You may use low doses of ibuprofen as needed for severe breakthrough pain. Make sure you are staying well-hydrated water. Return to the emergency room with any new, worsening, concerning symptoms.

## 2018-11-05 NOTE — ED Provider Notes (Signed)
[facility] EMERGENCY DEPARTMENT Provider Note   CSN: [id number] Arrival date & time: 11/04/18  1612     History   Chief Complaint Chief Complaint  Patient presents with  . Hypertension    HPI Ellen Marks is a 43 y.o. female presenting for evaluation of sinus pressure.  Patient states for the past several days, she has been having frontal headaches and sinus pressure, consistent with her sinus headaches.  She took 1 dose of ibuprofen and allergy medicine without improvement of symptoms.  She has not tried any thing else.  Additionally, she states her blood pressure was elevated at home.  She has been off of her blood pressure medicine for the past 2 weeks, and does not have a appointment with her PCP until the end of March.  She has no refills left on her blood pressure medicine.  She denies vision changes, slurred speech, chest pain, shortness breath, nausea, vomiting, abdominal pain, numbness/tingling/weakness.  HPI  Past Medical History:  Diagnosis Date  . Asthma   . Hypertension   . Seizures (HCC)    x 2.     There are no active problems to display for this patient.   Past Surgical History:  Procedure Laterality Date  . CESAREAN SECTION    . TUBAL LIGATION       OB History   No obstetric history on file.      Home Medications    Prior to Admission medications   Medication Sig Start Date End Date Taking? Authorizing Provider  benzonatate (TESSALON) 100 MG capsule Take 1 capsule (100 mg total) by mouth 3 (three) times daily as needed for cough. 12/03/16   Alvira MondaySchlossman, Erin, MD  hydrochlorothiazide (HYDRODIURIL) 25 MG tablet Take 25 mg by mouth daily.    [provider]  ibuprofen (ADVIL,MOTRIN) 600 MG tablet Take 1 tablet (600 mg total) every 6 (six) hours as needed by mouth. 08/27/17   Nira Connardama, Pedro Eduardo, MD  lisinopril-hydrochlorothiazide (ZESTORETIC) 10-12.5 MG tablet Take 1 tablet by mouth daily. 11/04/18   Hommer Cunliffe, PA-C    METOPROLOL TARTRATE PO Take by mouth.    [provider]  oxyCODONE-acetaminophen (PERCOCET/ROXICET) 5-325 MG tablet Take 2 tablets by mouth every 4 (four) hours as needed for severe pain. 08/12/16   Lorre NickAllen, Anthony, MD    Family History No family history on file.  Social History Social History   Tobacco Use  . Smoking status: Never Smoker  . Smokeless tobacco: Never Used  Substance Use Topics  . Alcohol use: No  . Drug use: No     Allergies   Zolpidem tartrate   Review of Systems Review of Systems  HENT: Positive for sinus pressure and sinus pain.   Neurological: Positive for headaches (Frontal headache over her sinuses).  All other systems reviewed and are negative.    Physical Exam Updated Vital Signs BP (!) 153/92 (BP Location: Right Arm)   Pulse 90   Temp 99 F (37.2 C)   Resp 20   Ht 5\' 3"  (1.6 m)   Wt 108.9 kg   LMP 10/17/2018   BMI 42.51 kg/m   Physical Exam Vitals signs and nursing note reviewed.  Constitutional:      General: She is not in acute distress.    Appearance: She is well-developed.     Comments: Sitting comfortably in the bed in no acute distress  HENT:     Head: Normocephalic and atraumatic.     Nose: Congestion present.  Right Sinus: Maxillary sinus tenderness and frontal sinus tenderness present.     Left Sinus: Maxillary sinus tenderness and frontal sinus tenderness present.     Comments: Nasal congestion.  Tenderness palpation of the frontal maxillary sinuses. Eyes:     Extraocular Movements: Extraocular movements intact.     Conjunctiva/sclera: Conjunctivae normal.     Pupils: Pupils are equal, round, and reactive to light.     Comments: EOMI and PERRLA.  No nystagmus.  Neck:     Musculoskeletal: Normal range of motion and neck supple.  Cardiovascular:     Rate and Rhythm: Normal rate and regular rhythm.     Pulses: Normal pulses.  Pulmonary:     Effort: Pulmonary effort is normal. No respiratory distress.      Breath sounds: Normal breath sounds. No wheezing.  Abdominal:     General: There is no distension.     Palpations: Abdomen is soft.     Tenderness: There is no abdominal tenderness.  Musculoskeletal: Normal range of motion.     Comments: Strength intact x4.  Sensation intact x4.  Radial pedal pulses intact bilaterally.  Skin:    General: Skin is warm and dry.     Capillary Refill: Capillary refill takes less than 2 seconds.  Neurological:     General: No focal deficit present.     Mental Status: She is alert and oriented to person, place, and time.     Cranial Nerves: No cranial nerve deficit.     Sensory: No sensory deficit.     Motor: No weakness.     Coordination: Coordination normal.     Gait: Gait normal.  Psychiatric:        Mood and Affect: Mood normal.      ED Treatments / Results  Labs (all labs ordered are listed, but only abnormal results are displayed) Labs Reviewed - No data to display  EKG None  Radiology No results found.  Procedures Procedures (including critical care time)  Medications Ordered in ED Medications  ketorolac (TORADOL) 15 MG/ML injection 15 mg (15 mg Intramuscular Given 11/04/18 2006)     Initial Impression / Assessment and Plan / ED Course  I have reviewed the triage vital signs and the nursing notes.  Pertinent labs & imaging results that were available during my care of the patient were reviewed by me and considered in my medical decision making (see chart for details).     Pt presenting for evaluation of sinus congestion and headache.  Physical exam reassuring, she appears nontoxic.  Additionally, patient reporting hypertension at home, is out of her medication.  Her exam reassuring, doubt headache is related to blood pressure.  Doubt CVA, ICH, SAH, or concerning endorgan damage from blood pressure.  Patient without chest pain or shortness of breath, I do not feel cardiac work-up is necessary at this time.  Will refill blood pressure  medication, and encourage follow-up with PCP.  Discussed treatment of sinuses with her home allergy medicine, Tylenol, intermittent ibuprofen, and nasal saline spray.  Again encouraged follow-up with PCP.  At this time, patient appears safe for discharge.  Return precautions given.  Patient states she understands and agrees plan.   Final Clinical Impressions(s) / ED Diagnoses   Final diagnoses:  Sinus headache  Hypertension, unspecified type    ED Discharge Orders         Ordered    lisinopril-hydrochlorothiazide (ZESTORETIC) 10-12.5 MG tablet  Daily     11/04/18 1954  Alveria ApleyCaccavale, Amere Iott, PA-C 11/05/18 0145    Doug SouJacubowitz, Sam, MD 11/05/18 1052

## 2019-10-25 IMAGING — DX DG KNEE COMPLETE 4+V*L*
4 series · 4 of 4 positions shown · non-contrast
Comparison: None.

CLINICAL DATA: Fall on concrete 1 hour ago. Abrasion to knee. Pain.

EXAM:
LEFT KNEE - COMPLETE 4+ VIEW

[knee ap]
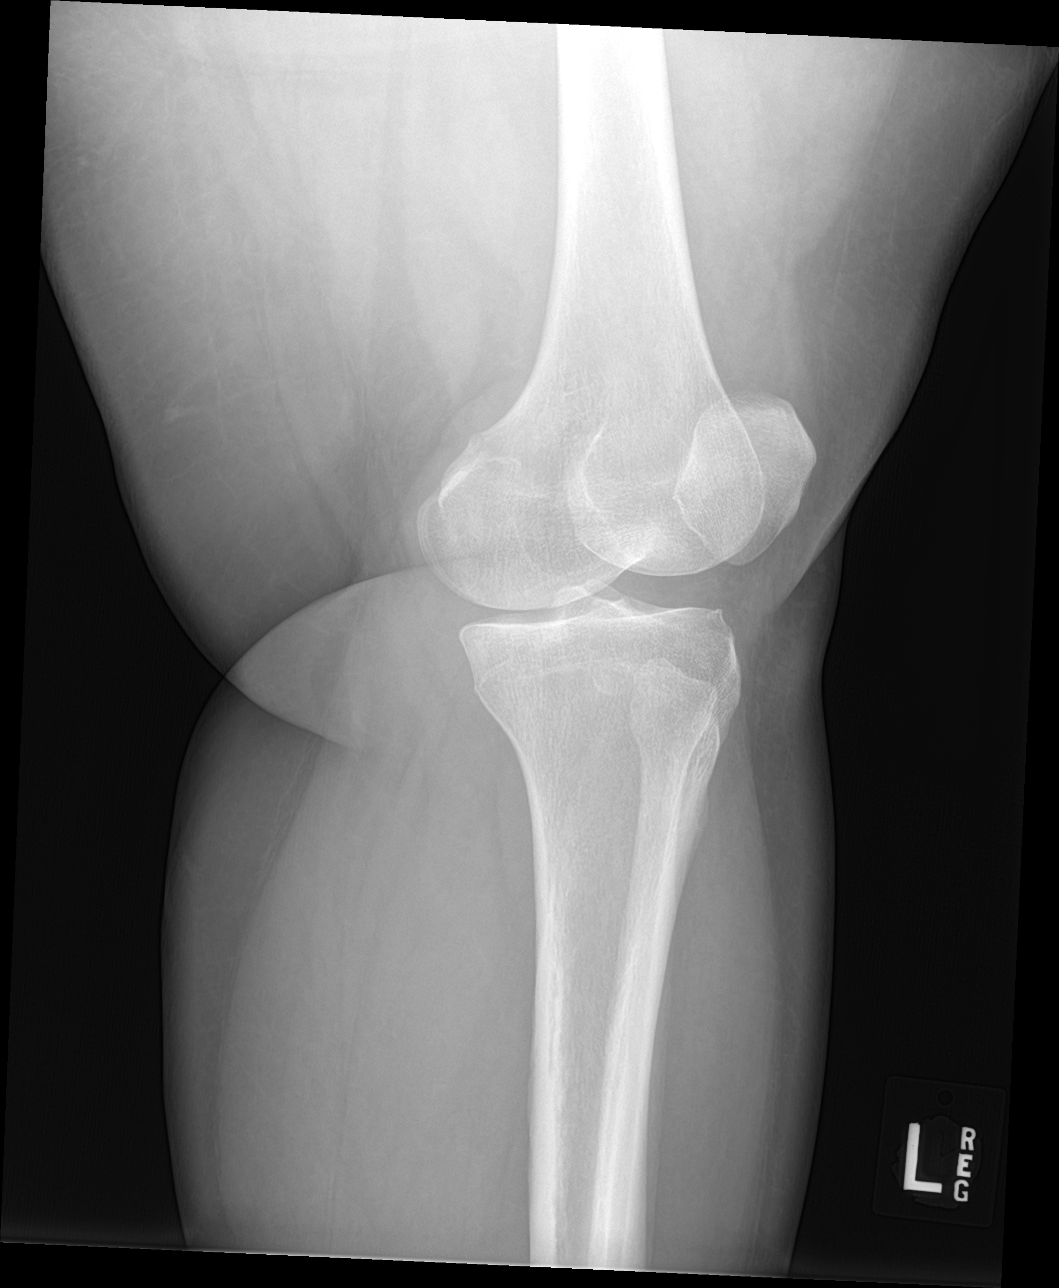

[knee lat]
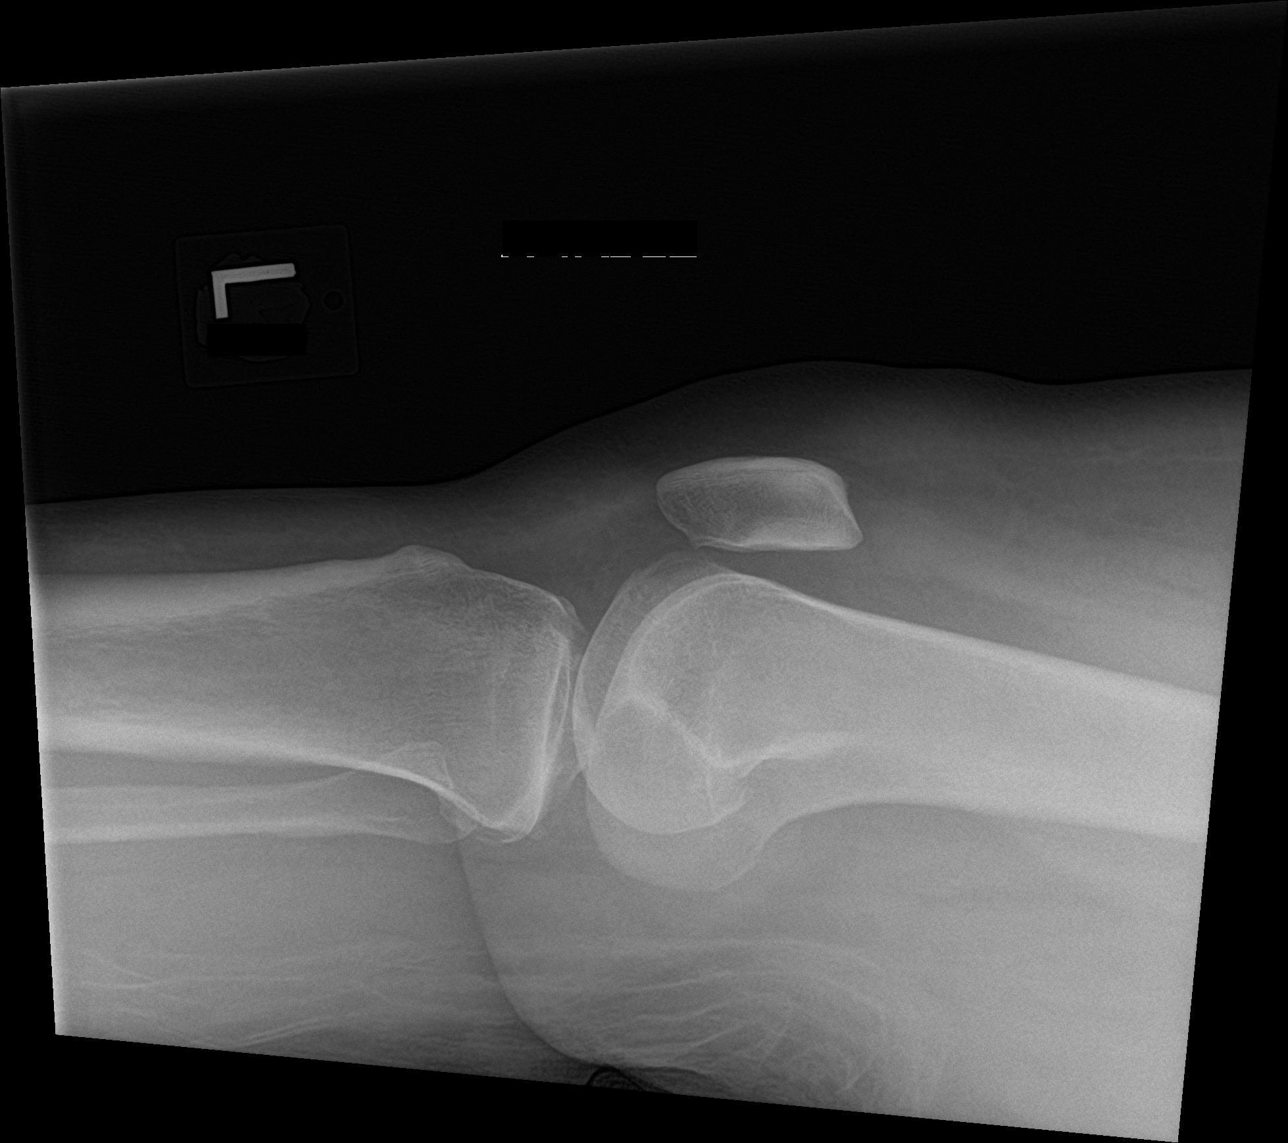

[knee obl (1 of 2)]
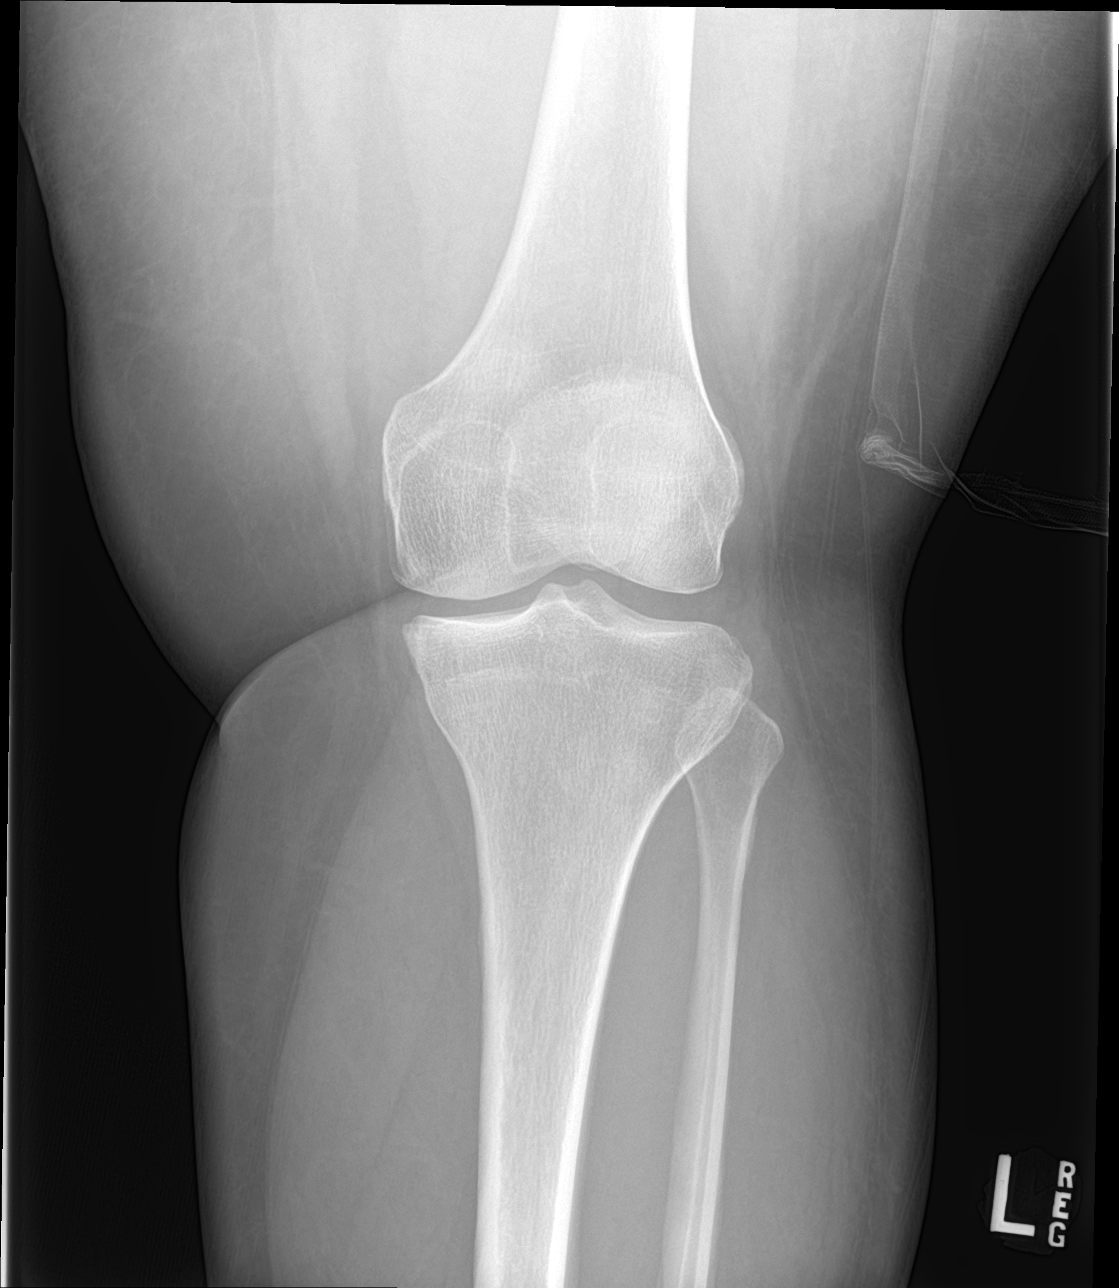

[knee obl (2 of 2)]
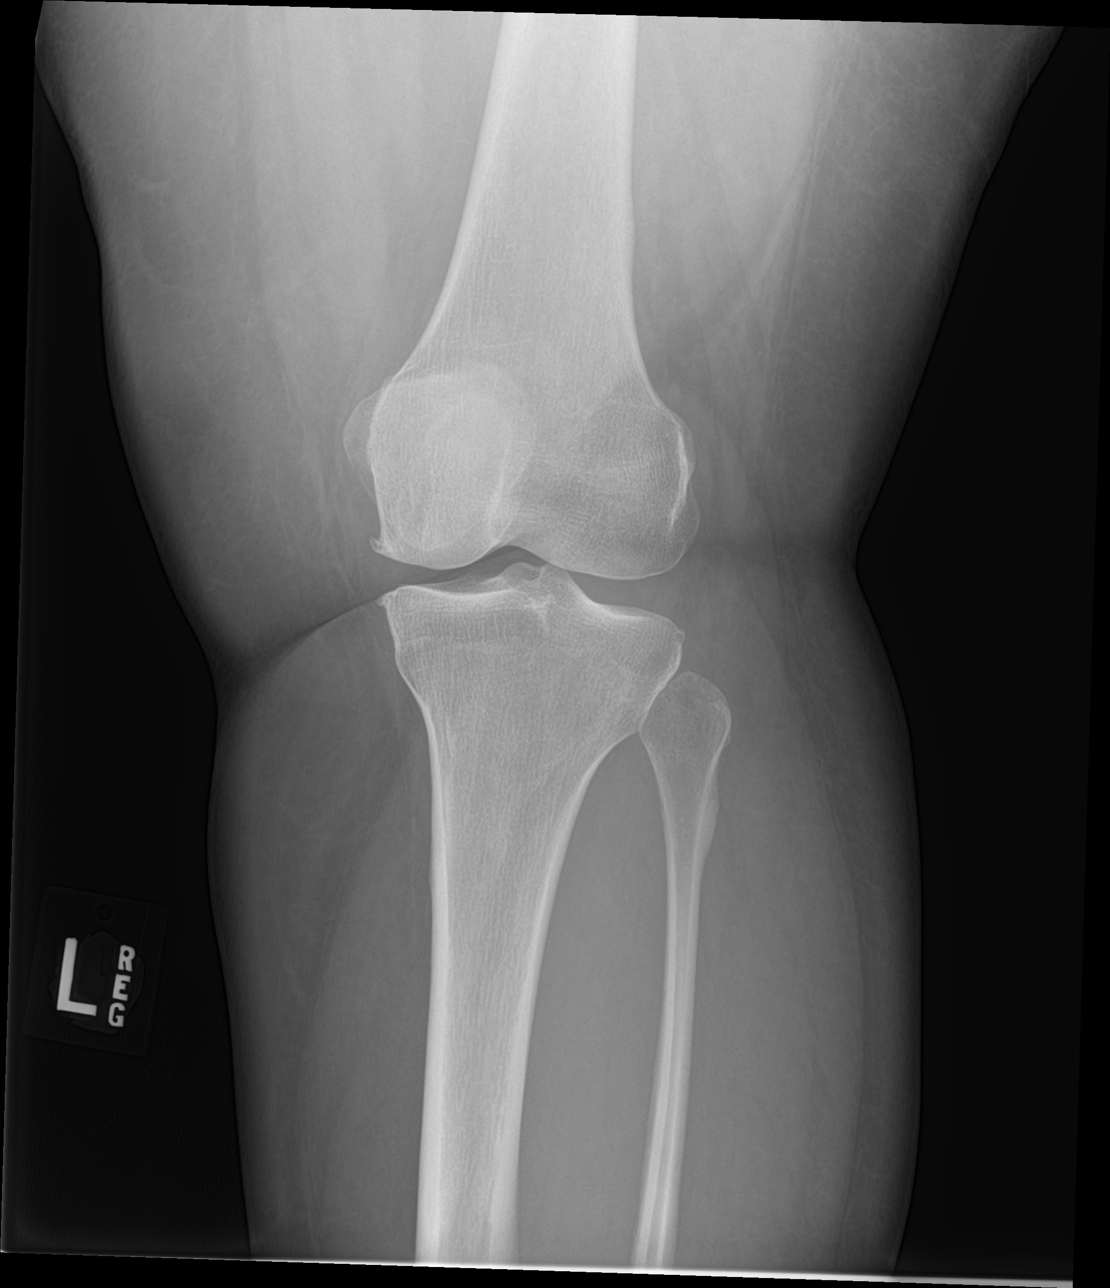

[4 of 4 positions shown; findings below may reference images not displayed]

FINDINGS: Mild degenerative changes are noted in the medial compartment of the
knee. The knee is located. No acute fractures present. There is no
significant effusion.
IMPRESSION: 1. Degenerative changes within the medial compartment of the knee.
2. No acute abnormality.

## 2023-06-07 ENCOUNTER — Other Ambulatory Visit: Payer: Self-pay

## 2023-06-07 ENCOUNTER — Emergency Department (HOSPITAL_BASED_OUTPATIENT_CLINIC_OR_DEPARTMENT_OTHER)
Admission: EM | Admit: 2023-06-07 | Discharge: 2023-06-07 | Disposition: A | Discharge status: Home / Self Care | Payer: 59 | Attending: Emergency Medicine | Admitting: Emergency Medicine

## 2023-06-07 ENCOUNTER — Encounter (HOSPITAL_BASED_OUTPATIENT_CLINIC_OR_DEPARTMENT_OTHER): Payer: Self-pay | Admitting: Emergency Medicine

## 2023-06-07 DIAGNOSIS — D649 Anemia, unspecified: Secondary | ICD-10-CM | POA: Diagnosis not present

## 2023-06-07 DIAGNOSIS — J45909 Unspecified asthma, uncomplicated: Secondary | ICD-10-CM | POA: Insufficient documentation

## 2023-06-07 DIAGNOSIS — D509 Iron deficiency anemia, unspecified: Secondary | ICD-10-CM

## 2023-06-07 DIAGNOSIS — R519 Headache, unspecified: Secondary | ICD-10-CM

## 2023-06-07 DIAGNOSIS — I10 Essential (primary) hypertension: Secondary | ICD-10-CM | POA: Insufficient documentation

## 2023-06-07 DIAGNOSIS — R600 Localized edema: Secondary | ICD-10-CM | POA: Diagnosis not present

## 2023-06-07 DIAGNOSIS — Z79899 Other long term (current) drug therapy: Secondary | ICD-10-CM | POA: Diagnosis not present

## 2023-06-07 LAB — CBC
HCT: 27.2 % — ABNORMAL LOW (ref 36.0–46.0)
Hemoglobin: 7.6 g/dL — ABNORMAL LOW (ref 12.0–15.0)
MCH: 17.5 pg — ABNORMAL LOW (ref 26.0–34.0)
MCHC: 27.9 g/dL — ABNORMAL LOW (ref 30.0–36.0)
MCV: 62.5 fL — ABNORMAL LOW (ref 80.0–100.0)
Platelets: 554 10*3/uL — ABNORMAL HIGH (ref 150–400)
RBC: 4.35 MIL/uL (ref 3.87–5.11)
RDW: 19.5 % — ABNORMAL HIGH (ref 11.5–15.5)
WBC: 10.5 10*3/uL (ref 4.0–10.5)
nRBC: 0 % (ref 0.0–0.2)

## 2023-06-07 LAB — BASIC METABOLIC PANEL
Anion gap: 11 (ref 5–15)
BUN: 15 mg/dL (ref 6–20)
CO2: 20 mmol/L — ABNORMAL LOW (ref 22–32)
Calcium: 8.6 mg/dL — ABNORMAL LOW (ref 8.9–10.3)
Chloride: 103 mmol/L (ref 98–111)
Creatinine, Ser: 0.76 mg/dL (ref 0.44–1.00)
GFR, Estimated: >60 mL/min (ref >60)
Glucose, Bld: 123 mg/dL — ABNORMAL HIGH (ref 70–99)
Potassium: 3.9 mmol/L (ref 3.5–5.1)
Sodium: 134 mmol/L — ABNORMAL LOW (ref 135–145)

## 2023-06-07 MED ORDER — DIPHENHYDRAMINE HCL 25 MG PO CAPS
25.0000 mg | ORAL_CAPSULE | Freq: Once | ORAL | Status: AC
  Administered 2023-06-07: 25 mg via ORAL
  Filled 2023-06-07: qty 1

## 2023-06-07 MED ORDER — VALSARTAN-HYDROCHLOROTHIAZIDE 160-12.5 MG PO TABS: 2.0000 | ORAL_TABLET | Freq: Every day | ORAL | 0 refills | Status: AC

## 2023-06-07 MED ORDER — KETOROLAC TROMETHAMINE 60 MG/2ML IM SOLN
60.0000 mg | Freq: Once | INTRAMUSCULAR | Status: AC
  Administered 2023-06-07: 60 mg via INTRAMUSCULAR
  Filled 2023-06-07: qty 2

## 2023-06-07 MED ORDER — HYDROCHLOROTHIAZIDE 25 MG PO TABS
25.0000 mg | ORAL_TABLET | Freq: Once | ORAL | Status: AC
  Administered 2023-06-07: 25 mg via ORAL
  Filled 2023-06-07: qty 1

## 2023-06-07 MED ORDER — VALSARTAN-HYDROCHLOROTHIAZIDE 160-12.5 MG PO TABS
2.0000 | ORAL_TABLET | Freq: Every day | ORAL | 0 refills | Status: DC
  Filled 2023-06-07: qty 60, 30d supply, fill #0

## 2023-06-07 NOTE — ED Provider Notes (Signed)
[place] EMERGENCY DEPARTMENT AT [facility] Provider Note   CSN: [id number] Arrival date & time: 06/07/23  1819     History {Add pertinent medical, surgical, social history, OB history to HPI:1} Chief Complaint  Patient presents with   Hypertension   Headache    Ellen Marks is a 47 y.o. female.  HPI     Know has arthritis, under a lot of stress Has not seen doctor because too busy working, not taking time for self.   Works with children Fluid on the legs making it hard to walk, because it feels tight. No chest pain, dyspnea Denies numbness, weakness, difficulty talking or walking, visual changes or facial droop.  Right knee pain, feels like bone rubbing agAINST skin No fever Not lightheaded or dizzy.  Last week felt some congestion, has allergies   Headache started today, worsened throughout the day. Headache throbbing at top of head.  Know not supposed to be taking ibuprofen but it is helping.  Was on valsartan 320 and hydrochlorothiazide 25 combination medicine .  wAS taking it intermittently recently, until ran out with last pill Monday.    No black or bloody stools, does have heavy periods on the third day menses now, maybe 4 times total for 24hr needing to changes   Hx of anemia but not sure levels   Past Medical History:  Diagnosis Date   Asthma    Hypertension    Seizures (HCC)    x 2.     Home Medications Prior to Admission medications   Medication Sig Start Date End Date Taking? Authorizing Provider  benzonatate (TESSALON) 100 MG capsule Take 1 capsule (100 mg total) by mouth 3 (three) times daily as needed for cough. 12/03/16   Alvira Monday, MD  hydrochlorothiazide (HYDRODIURIL) 25 MG tablet Take 25 mg by mouth daily.    [provider]  ibuprofen (ADVIL,MOTRIN) 600 MG tablet Take 1 tablet (600 mg total) every 6 (six) hours as needed by mouth. 08/27/17   Nira Conn, MD  lisinopril-hydrochlorothiazide (ZESTORETIC)  10-12.5 MG tablet Take 1 tablet by mouth daily. 11/04/18   Caccavale, Sophia, PA-C  METOPROLOL TARTRATE PO Take by mouth.    [provider]  oxyCODONE-acetaminophen (PERCOCET/ROXICET) 5-325 MG tablet Take 2 tablets by mouth every 4 (four) hours as needed for severe pain. 08/12/16   Lorre Nick, MD      Allergies    Zolpidem tartrate    Review of Systems   Review of Systems  Physical Exam Updated Vital Signs BP (!) 184/98 (BP Location: Left Arm)   Pulse 99   Temp 98.4 F (36.9 C) (Oral)   Resp 20   Ht 5\' 3"  (1.6 m)   Wt 99.8 kg   LMP 06/06/2023   SpO2 99%   BMI 38.97 kg/m  Physical Exam  ED Results / Procedures / Treatments   Labs (all labs ordered are listed, but only abnormal results are displayed) Labs Reviewed  CBC - Abnormal; Notable for the following components:      Result Value   Hemoglobin 7.6 (*)    HCT 27.2 (*)    MCV 62.5 (*)    MCH 17.5 (*)    MCHC 27.9 (*)    RDW 19.5 (*)    Platelets 554 (*)    All other components within normal limits  BASIC METABOLIC PANEL - Abnormal; Notable for the following components:   Sodium 134 (*)    CO2 20 (*)  Glucose, Bld 123 (*)    Calcium 8.6 (*)    All other components within normal limits    EKG None  Radiology No results found.  Procedures Procedures  {Document cardiac monitor, telemetry assessment procedure when appropriate:1}  Medications Ordered in ED Medications - No data to display  ED Course/ Medical Decision Making/ A&P   {   Click here for ABCD2, HEART and other calculatorsREFRESH Note before signing :1}                              Medical Decision Making Amount and/or Complexity of Data Reviewed Labs: ordered.   ***  {Document critical care time when appropriate:1} {Document review of labs and clinical decision tools ie heart score, Chads2Vasc2 etc:1}  {Document your independent review of radiology images, and any outside records:1} {Document your discussion with family  members, caretakers, and with consultants:1} {Document social determinants of health affecting pt's care:1} {Document your decision making why or why not admission, treatments were needed:1} Final Clinical Impression(s) / ED Diagnoses Final diagnoses:  None    Rx / DC Orders ED Discharge Orders     None

## 2023-06-07 NOTE — ED Triage Notes (Signed)
Pt endorses headache today with HTN. Pt has hx of HTN but not on meds due to needing to see doc. Last took a HTN med a week ago. Also right knee pain since this weekend, hx of arthritis.

## 2023-06-07 NOTE — Discharge Instructions (Addendum)
Take your iron and prescribed blood pressure medications. Follow up with your doctor for recheck of your blood levels.

## 2023-06-08 ENCOUNTER — Other Ambulatory Visit (HOSPITAL_BASED_OUTPATIENT_CLINIC_OR_DEPARTMENT_OTHER): Payer: Self-pay

## 2023-11-19 ENCOUNTER — Emergency Department (HOSPITAL_BASED_OUTPATIENT_CLINIC_OR_DEPARTMENT_OTHER): Payer: 59

## 2023-11-19 ENCOUNTER — Other Ambulatory Visit: Payer: Self-pay

## 2023-11-19 ENCOUNTER — Encounter (HOSPITAL_BASED_OUTPATIENT_CLINIC_OR_DEPARTMENT_OTHER): Payer: Self-pay

## 2023-11-19 ENCOUNTER — Emergency Department (HOSPITAL_BASED_OUTPATIENT_CLINIC_OR_DEPARTMENT_OTHER)
Admission: EM | Admit: 2023-11-19 | Discharge: 2023-11-19 | Disposition: A | Discharge status: Home / Self Care | Payer: 59 | Attending: Emergency Medicine | Admitting: Emergency Medicine

## 2023-11-19 DIAGNOSIS — S92152A Displaced avulsion fracture (chip fracture) of left talus, initial encounter for closed fracture: Secondary | ICD-10-CM | POA: Diagnosis not present

## 2023-11-19 DIAGNOSIS — W19XXXA Unspecified fall, initial encounter: Secondary | ICD-10-CM

## 2023-11-19 DIAGNOSIS — W108XXA Fall (on) (from) other stairs and steps, initial encounter: Secondary | ICD-10-CM | POA: Insufficient documentation

## 2023-11-19 DIAGNOSIS — M25562 Pain in left knee: Secondary | ICD-10-CM

## 2023-11-19 DIAGNOSIS — M25561 Pain in right knee: Secondary | ICD-10-CM | POA: Diagnosis not present

## 2023-11-19 DIAGNOSIS — S99912A Unspecified injury of left ankle, initial encounter: Secondary | ICD-10-CM | POA: Diagnosis present

## 2023-11-19 DIAGNOSIS — S82892A Other fracture of left lower leg, initial encounter for closed fracture: Secondary | ICD-10-CM

## 2023-11-19 MED ORDER — IBUPROFEN 800 MG PO TABS
800.0000 mg | ORAL_TABLET | Freq: Once | ORAL | Status: AC
Start: 2023-11-19 — End: 2023-11-19
  Administered 2023-11-19: 800 mg via ORAL
  Filled 2023-11-19: qty 1

## 2023-11-19 MED ORDER — IBUPROFEN 600 MG PO TABS: 600.0000 mg | ORAL_TABLET | Freq: Four times a day (QID) | ORAL | 0 refills | Status: AC | PRN

## 2023-11-19 MED ORDER — ACETAMINOPHEN 325 MG PO TABS: 650.0000 mg | ORAL_TABLET | Freq: Four times a day (QID) | ORAL | 0 refills | Status: AC | PRN

## 2023-11-19 MED ORDER — ACETAMINOPHEN 325 MG PO TABS
650.0000 mg | ORAL_TABLET | Freq: Once | ORAL | Status: AC
Start: 2023-11-19 — End: 2023-11-19
  Administered 2023-11-19: 650 mg via ORAL
  Filled 2023-11-19: qty 2

## 2023-11-19 NOTE — ED Triage Notes (Signed)
Reports falling down 5 stairs. Reports L leg pain and tingling in L toes. BIL knee throbbing.   Denies head injury or LOC, no blood thinners.

## 2023-11-19 NOTE — Discharge Instructions (Addendum)
Your x-ray shows that you may have a small chip fracture of your left ankle.  I would recommend you follow-up with an orthopedic doctor for this issue.  You do not have any obvious broken bones in your knees, but you may have strained or sprained one of the muscles or ligaments in your knees.  I would recommend that you use crutches much as possible and try to keep weight off your left foot is much as possible for the next 7 days.  Try to keep your foot elevated at home and apply ice for 10 minutes on and off to your left ankle, at least for the next 2 days.  You can take ibuprofen 600 mg every 6 hours with food, as well as Tylenol for pain.  If you do need to walk on your left foot for balance, use the cam boot for stability.

## 2023-11-19 NOTE — ED Provider Notes (Signed)
[place] [facility] Provider Note   CSN: [id number] Arrival date & time: 11/19/23  1053     History {Add pertinent medical, surgical, social history, OB history to HPI:1} Chief Complaint  Patient presents with   Ellen Marks is a 48 y.o. female presenting to the ED with a mechanical fall going down the stairs today, lost her balance, says that she hyper flex both knees, but then pulled out her left leg and rolled the left ankle afterwards.  She denies striking her head or loss of conscious.  She is not on blood thinners.  She reports that she is having pain in both knees but more so pain in her left ankle.  She is unable to bear weight on the left foot.  She did not take any medications today yet for pain.  She also did not take her blood pressure medication this morning.  She typically does have arthritis or chronic knee joint pain.  HPI     Home Medications Prior to Admission medications   Medication Sig Start Date End Date Taking? Authorizing Provider  benzonatate (TESSALON) 100 MG capsule Take 1 capsule (100 mg total) by mouth 3 (three) times daily as needed for cough. 12/03/16   Alvira Monday, MD  ibuprofen (ADVIL,MOTRIN) 600 MG tablet Take 1 tablet (600 mg total) every 6 (six) hours as needed by mouth. 08/27/17   Nira Conn, MD  METOPROLOL TARTRATE PO Take by mouth.    [provider]  oxyCODONE-acetaminophen (PERCOCET/ROXICET) 5-325 MG tablet Take 2 tablets by mouth every 4 (four) hours as needed for severe pain. 08/12/16   Lorre Nick, MD  valsartan-hydrochlorothiazide (DIOVAN HCT) 160-12.5 MG tablet Take 2 tablets by mouth daily. 06/07/23 07/07/23  Roemhildt, Lorin T, PA-C      Allergies    Zolpidem tartrate    Review of Systems   Review of Systems  Physical Exam Updated Vital Signs BP (!) 185/102   Pulse 99   Temp 98 F (36.7 C) (Oral)   Resp 20   Ht 5\' 3"  (1.6 m)   Wt 99.8 kg   LMP 11/14/2023  (Exact Date)   SpO2 99%   BMI 38.97 kg/m  Physical Exam Constitutional:      General: She is not in acute distress.    Appearance: She is obese.  HENT:     Head: Normocephalic and atraumatic.  Eyes:     Conjunctiva/sclera: Conjunctivae normal.     Pupils: Pupils are equal, round, and reactive to light.  Cardiovascular:     Rate and Rhythm: Normal rate and regular rhythm.  Pulmonary:     Effort: Pulmonary effort is normal. No respiratory distress.  Musculoskeletal:     Comments: Patient is able to fully flex and extend the bilateral lower extremities at the knees.  She does have tenderness of the bilateral prepatellar region of both knees, without visible crepitus or deformity.  She has swelling and tenderness of the left lateral malleoli with very limited range of motion due to pain.  No visible deformity, swelling or significant tenderness at the base of fifth metatarsal or the left midfoot.  Skin:    General: Skin is warm and dry.  Neurological:     General: No focal deficit present.     Mental Status: She is alert. Mental status is at baseline.  Psychiatric:        Mood and Affect: Mood normal.  Behavior: Behavior normal.     ED Results / Procedures / Treatments   Labs (all labs ordered are listed, but only abnormal results are displayed) Labs Reviewed - No data to display  EKG None  Radiology No results found.  Procedures Procedures  {Document cardiac monitor, telemetry assessment procedure when appropriate:1}  Medications Ordered in ED Medications - No data to display  ED Course/ Medical Decision Making/ A&P   {   Click here for ABCD2, HEART and other calculatorsREFRESH Note before signing :1}                              Medical Decision Making Amount and/or Complexity of Data Reviewed Radiology: ordered.  Risk OTC drugs. Prescription drug management.   Patient is here with a mechanical fall and isolated hyperflexion injury of the knees, also  rolling injury of the left ankle.  X-rays of the knees and ankle ordered and personally viewed interpreted, notable for ***  She was given Motrin and Tylenol for pain.  She is neurovascularly intact on exam.  No other significant traumatic injuries noted on my exam.    {Document critical care time when appropriate:1} {Document review of labs and clinical decision tools ie heart score, Chads2Vasc2 etc:1}  {Document your independent review of radiology images, and any outside records:1} {Document your discussion with family members, caretakers, and with consultants:1} {Document social determinants of health affecting pt's care:1} {Document your decision making why or why not admission, treatments were needed:1} Final Clinical Impression(s) / ED Diagnoses Final diagnoses:  None    Rx / DC Orders ED Discharge Orders     None

## 2024-02-20 ENCOUNTER — Encounter (HOSPITAL_BASED_OUTPATIENT_CLINIC_OR_DEPARTMENT_OTHER): Payer: Self-pay | Admitting: *Deleted

## 2024-02-20 ENCOUNTER — Emergency Department (HOSPITAL_BASED_OUTPATIENT_CLINIC_OR_DEPARTMENT_OTHER)
Admission: EM | Admit: 2024-02-20 | Discharge: 2024-02-20 | Disposition: A | Discharge status: Home / Self Care | Attending: Emergency Medicine | Admitting: Emergency Medicine

## 2024-02-20 ENCOUNTER — Other Ambulatory Visit: Payer: Self-pay

## 2024-02-20 DIAGNOSIS — I10 Essential (primary) hypertension: Secondary | ICD-10-CM | POA: Diagnosis not present

## 2024-02-20 DIAGNOSIS — R1030 Lower abdominal pain, unspecified: Secondary | ICD-10-CM | POA: Diagnosis present

## 2024-02-20 DIAGNOSIS — D509 Iron deficiency anemia, unspecified: Secondary | ICD-10-CM | POA: Insufficient documentation

## 2024-02-20 DIAGNOSIS — Z79899 Other long term (current) drug therapy: Secondary | ICD-10-CM | POA: Diagnosis not present

## 2024-02-20 DIAGNOSIS — N939 Abnormal uterine and vaginal bleeding, unspecified: Secondary | ICD-10-CM | POA: Insufficient documentation

## 2024-02-20 DIAGNOSIS — J45909 Unspecified asthma, uncomplicated: Secondary | ICD-10-CM | POA: Diagnosis not present

## 2024-02-20 DIAGNOSIS — R102 Pelvic and perineal pain: Secondary | ICD-10-CM

## 2024-02-20 LAB — URINALYSIS, ROUTINE W REFLEX MICROSCOPIC
Bilirubin Urine: NEGATIVE
Glucose, UA: NEGATIVE mg/dL
Ketones, ur: NEGATIVE mg/dL
Leukocytes,Ua: NEGATIVE
Nitrite: NEGATIVE
Protein, ur: NEGATIVE mg/dL
Specific Gravity, Urine: 1.015 (ref 1.005–1.030)
pH: 6.5 (ref 5.0–8.0)

## 2024-02-20 LAB — CBC WITH DIFFERENTIAL/PLATELET
Abs Immature Granulocytes: 0.08 10*3/uL — ABNORMAL HIGH (ref 0.00–0.07)
Basophils Absolute: 0.1 10*3/uL (ref 0.0–0.1)
Basophils Relative: 1 %
Eosinophils Absolute: 0.4 10*3/uL (ref 0.0–0.5)
Eosinophils Relative: 4 %
HCT: 29.8 % — ABNORMAL LOW (ref 36.0–46.0)
Hemoglobin: 8.6 g/dL — ABNORMAL LOW (ref 12.0–15.0)
Immature Granulocytes: 1 %
Lymphocytes Relative: 31 %
Lymphs Abs: 3 10*3/uL (ref 0.7–4.0)
MCH: 18.7 pg — ABNORMAL LOW (ref 26.0–34.0)
MCHC: 28.9 g/dL — ABNORMAL LOW (ref 30.0–36.0)
MCV: 64.8 fL — ABNORMAL LOW (ref 80.0–100.0)
Monocytes Absolute: 0.6 10*3/uL (ref 0.1–1.0)
Monocytes Relative: 6 %
Neutro Abs: 5.7 10*3/uL (ref 1.7–7.7)
Neutrophils Relative %: 57 %
Platelets: 610 10*3/uL — ABNORMAL HIGH (ref 150–400)
RBC: 4.6 MIL/uL (ref 3.87–5.11)
RDW: 16.5 % — ABNORMAL HIGH (ref 11.5–15.5)
WBC: 9.9 10*3/uL (ref 4.0–10.5)
nRBC: 0 % (ref 0.0–0.2)

## 2024-02-20 LAB — COMPREHENSIVE METABOLIC PANEL WITH GFR
ALT: 6 U/L (ref 0–44)
AST: 15 U/L (ref 15–41)
Albumin: 4 g/dL (ref 3.5–5.0)
Alkaline Phosphatase: 76 U/L (ref 38–126)
Anion gap: 13 (ref 5–15)
BUN: 13 mg/dL (ref 6–20)
CO2: 21 mmol/L — ABNORMAL LOW (ref 22–32)
Calcium: 8.9 mg/dL (ref 8.9–10.3)
Chloride: 104 mmol/L (ref 98–111)
Creatinine, Ser: 0.79 mg/dL (ref 0.44–1.00)
GFR, Estimated: >60 mL/min (ref >60)
Glucose, Bld: 116 mg/dL — ABNORMAL HIGH (ref 70–99)
Potassium: 3.9 mmol/L (ref 3.5–5.1)
Sodium: 138 mmol/L (ref 135–145)
Total Bilirubin: <0.2 mg/dL (ref 0.0–1.2)
Total Protein: 7.5 g/dL (ref 6.5–8.1)

## 2024-02-20 LAB — URINALYSIS, MICROSCOPIC (REFLEX)

## 2024-02-20 LAB — WET PREP, GENITAL
Clue Cells Wet Prep HPF POC: NONE SEEN
Sperm: NONE SEEN
Trich, Wet Prep: NONE SEEN
WBC, Wet Prep HPF POC: <10 (ref <10)
Yeast Wet Prep HPF POC: NONE SEEN

## 2024-02-20 LAB — PREGNANCY, URINE: Preg Test, Ur: NEGATIVE

## 2024-02-20 MED ORDER — NAPROXEN 500 MG PO TABS: 500.0000 mg | ORAL_TABLET | Freq: Two times a day (BID) | ORAL | 0 refills | Status: AC

## 2024-02-20 MED ORDER — ACETAMINOPHEN 325 MG PO TABS
650.0000 mg | ORAL_TABLET | Freq: Once | ORAL | Status: AC
  Administered 2024-02-20: 650 mg via ORAL
  Filled 2024-02-20: qty 2

## 2024-02-20 NOTE — ED Notes (Signed)
 ..  The patient is A&OX4, ambulatory at d/c with independent steady gait, NAD. Pt verbalized understanding of d/c instructions, prescription and follow up care.

## 2024-02-20 NOTE — ED Notes (Signed)
Haley,PA at bedside

## 2024-02-20 NOTE — ED Notes (Signed)
 Pt is ambulatory to restroom with independent steady gait.

## 2024-02-20 NOTE — ED Triage Notes (Signed)
 Pt has been having lower abdominal pain and lower back pain and has been having spotting. She has been nauseated as well.  LMP was earlier this month.

## 2024-02-20 NOTE — ED Notes (Signed)
 Pt is reporting a mild headache. [place], [place] informed.

## 2024-02-20 NOTE — ED Provider Notes (Signed)
 [place] [facility] Provider Note   CSN: [id number] Arrival date & time: 02/20/24  1655     History  Chief Complaint  Patient presents with   Abdominal Pain    Ellen Marks is a 48 y.o. female with a history of asthma, hypertension, and seizures who presents the ED today for abdominal pain.  Patient endorses lower abdominal pain for the past several days as well as seeing blood when she wipes after urinating.  States that she had her menstrual cycle at the beginning the month.  Also endorses intermittent flank pain.  Denies fevers, nausea, vomiting, dysuria, or changes to bowel habits.  No vaginal discharge or dyspareunia.  Had similar symptoms back in December and was treated for PID with improvement until now.  States that she has not been sexually active in the past month or so but only endorses having 1 partner.    Home Medications Prior to Admission medications   Medication Sig Start Date End Date Taking? Authorizing Provider  naproxen  (NAPROSYN ) 500 MG tablet Take 1 tablet (500 mg total) by mouth 2 (two) times daily for 14 days. 02/20/24 03/05/24 Yes Sonnie Dusky, PA-C  acetaminophen  (TYLENOL ) 325 MG tablet Take 2 tablets (650 mg total) by mouth every 6 (six) hours as needed for up to 30 doses for moderate pain (pain score 4-6) or mild pain (pain score 1-3). 11/19/23   Arvilla Birmingham, MD  benzonatate  (TESSALON ) 100 MG capsule Take 1 capsule (100 mg total) by mouth 3 (three) times daily as needed for cough. 12/03/16   Scarlette Currier, MD  ibuprofen  (ADVIL ) 600 MG tablet Take 1 tablet (600 mg total) by mouth every 6 (six) hours as needed for up to 30 doses for mild pain (pain score 1-3) or moderate pain (pain score 4-6). 11/19/23   Arvilla Birmingham, MD  ibuprofen  (ADVIL ,MOTRIN ) 600 MG tablet Take 1 tablet (600 mg total) every 6 (six) hours as needed by mouth. 08/27/17   Lindle Rhea, MD  METOPROLOL TARTRATE PO Take by mouth.    [provider]  oxyCODONE -acetaminophen  (PERCOCET/ROXICET) 5-325 MG tablet Take 2 tablets by mouth every 4 (four) hours as needed for severe pain. 08/12/16   Lind Repine, MD  valsartan -hydrochlorothiazide  (DIOVAN  HCT) 160-12.5 MG tablet Take 2 tablets by mouth daily. 06/07/23 07/07/23  Roemhildt, Lorin T, PA-C      Allergies    Zolpidem tartrate    Review of Systems   Review of Systems  Gastrointestinal:  Positive for abdominal pain.  All other systems reviewed and are negative.   Physical Exam Updated Vital Signs BP (!) 173/87   Pulse 93   Temp 98 F (36.7 C) (Oral)   Resp 18   LMP 01/27/2024 (Approximate)   SpO2 99%  Physical Exam Vitals and nursing note reviewed. Exam conducted with a chaperone present.  Constitutional:      General: She is not in acute distress.    Appearance: Normal appearance.  HENT:     Head: Normocephalic and atraumatic.     Mouth/Throat:     Mouth: Mucous membranes are moist.  Eyes:     Conjunctiva/sclera: Conjunctivae normal.     Pupils: Pupils are equal, round, and reactive to light.  Cardiovascular:     Rate and Rhythm: Normal rate and regular rhythm.     Pulses: Normal pulses.     Heart sounds: Normal heart sounds.  Pulmonary:     Effort: Pulmonary effort is normal.  Breath sounds: Normal breath sounds.  Abdominal:     Palpations: Abdomen is soft.     Tenderness: There is abdominal tenderness.     Comments: Lower abdominal tenderness to palpation  Genitourinary:    General: Normal vulva.     Comments: Blood present in the vaginal vault, yellow discharge within the vaginal vault as well. No cervical motion tenderness. Musculoskeletal:        General: Normal range of motion.     Cervical back: Normal range of motion.  Skin:    General: Skin is warm and dry.     Findings: No rash.  Neurological:     General: No focal deficit present.     Mental Status: She is alert.  Psychiatric:        Mood and Affect: Mood normal.         Behavior: Behavior normal.    ED Results / Procedures / Treatments   Labs (all labs ordered are listed, but only abnormal results are displayed) Labs Reviewed  URINALYSIS, ROUTINE W REFLEX MICROSCOPIC - Abnormal; Notable for the following components:      Result Value   APPearance HAZY (*)    Hgb urine dipstick SMALL (*)    All other components within normal limits  COMPREHENSIVE METABOLIC PANEL WITH GFR - Abnormal; Notable for the following components:   CO2 21 (*)    Glucose, Bld 116 (*)    All other components within normal limits  CBC WITH DIFFERENTIAL/PLATELET - Abnormal; Notable for the following components:   Hemoglobin 8.6 (*)    HCT 29.8 (*)    MCV 64.8 (*)    MCH 18.7 (*)    MCHC 28.9 (*)    RDW 16.5 (*)    Platelets 610 (*)    Abs Immature Granulocytes 0.08 (*)    All other components within normal limits  URINALYSIS, MICROSCOPIC (REFLEX) - Abnormal; Notable for the following components:   Bacteria, UA RARE (*)    All other components within normal limits  WET PREP, GENITAL  PREGNANCY, URINE  GC/CHLAMYDIA PROBE AMP ([place]) NOT AT [facility]    EKG None  Radiology No results found.  Procedures Procedures    Medications Ordered in ED Medications  acetaminophen  (TYLENOL ) tablet 650 mg (650 mg Oral Given 02/20/24 1916)    ED Course/ Medical Decision Making/ A&P                                 Medical Decision Making Amount and/or Complexity of Data Reviewed Labs: ordered. Radiology: ordered.   This patient presents to the ED for concern of lower abdominal pain, this involves an extensive number of treatment options, and is a complaint that carries with it a high risk of complications and morbidity.   Differential diagnosis includes: IUP, ectopic pregnancy, ovarian torsion, tuboovarian abscess, UTI, STI, PID, yeast infection, uterine fibroids, BV, etc.   Comorbidities  See HPI above   Additional History  Additional history obtained from  prior records   Lab Tests  I ordered and personally interpreted labs.  The pertinent results include:   Hemoglobin of 8.6, otherwise CBC is within normal limits for patient CMP is reassuring UA shows 6-10 RBCs and rare bacteria. No signs of infection. Negative pregnancy test Negative wet prep GC/Chlamydia pending   Imaging Studies  Was told ultrasound not available - imaging not ordered at this time. Since no emergent signs or  symptoms - advised outpatient follow up for reevaluation.   Problem List / ED Course / Critical Interventions / Medication Management  Endorses pain across the lower abdomen and low back with scant vaginal bleeding with wiping.  No blood clots.  She is not needing to use any extra tampons or pads.  States that her menstrual cycle completed at the beginning of the month. Similar episode in December and was treated for PID at the time.  Declines any vaginal discharge, pain, or odor. Pelvic exam does show blood in the vaginal vault but there is no cervical motion tenderness on exam.  Denies pain at all on bimanual exam.  Labs are reassuring. Most likely nonemergent diagnosis like ovarian torsion or tubo-ovarian abscess.  Patient is safe to follow-up with gynecology outpatient. I ordered medications including: Tylenol  for headache - given prior to discharge I have reviewed the patients home medicines and have made adjustments as needed Blood pressure elevated in the ED.  Patient states that she stopped taking her blood pressure patients for a while.  She started to get headache in the ED, could related to the blood pressure.  Denies any chest pain or shortness of breath.  Instructed patient to start taking her blood pressure medication and monitor blood pressures at home. Ambulatory referral for hematology provided for patient to follow-up with for her in deficiency anemia.   Social Determinants of Health  Access to healthcare   Test / Admission -  Considered  Discussed plans with patient.  All questions were answered. She is stable and safe for discharge home. Return precautions given.       Final Clinical Impression(s) / ED Diagnoses Final diagnoses:  Pelvic pain in female  Iron deficiency anemia, unspecified iron deficiency anemia type  Vaginal bleeding    Rx / DC Orders ED Discharge Orders          Ordered    Ambulatory referral to Hematology / Oncology        02/20/24 1915    naproxen  (NAPROSYN ) 500 MG tablet  2 times daily        02/20/24 1920              Sonnie Dusky, PA-C 02/20/24 2158    Hershel Los, MD 02/20/24 2202

## 2024-02-20 NOTE — Discharge Instructions (Addendum)
 Your labs are reassuring.  The gonorrhea/committee testing is a send out test.  You will receive a call from the hospital if it comes back positive and you will receive treatment at that time. If you do not hear from the hospital, that means your testing is negative.  Unfortunately we do not have ultrasound here tonight.  I have provided you with information for OB/GYN that you can follow-up with for further evaluation.  I have sent a prescription of naproxen  you can take twice a day as needed for abdominal pain/back pain.  Your hemoglobin is 8.6, which is low.  Continue taking your iron supplements.  I have sent a referral to hematology to establish with for further evaluation and management of your iron deficiency anemia.  They will give you a call next couple days to schedule an appointment.  Your blood pressure is elevated in the ED as well. Please make sure you're taking your blood pressure medications and follow up with your PCP if it remains elevated.  Get help right away if: You faint. You have to change pads every hour. You have pain in your belly. You have a fever or chills. You get sweaty or weak. You pass large blood clots from your vagina.

## 2024-02-21 LAB — GC/CHLAMYDIA PROBE AMP (~~LOC~~) NOT AT ARMC
Chlamydia: NEGATIVE
Comment: NEGATIVE
Comment: NORMAL
Neisseria Gonorrhea: NEGATIVE

## 2024-03-01 ENCOUNTER — Other Ambulatory Visit: Payer: Self-pay | Admitting: Family

## 2024-03-01 DIAGNOSIS — D649 Anemia, unspecified: Secondary | ICD-10-CM

## 2024-03-04 ENCOUNTER — Inpatient Hospital Stay: Attending: Family Medicine

## 2024-03-04 ENCOUNTER — Inpatient Hospital Stay: Admitting: Family

## 2024-03-13 ENCOUNTER — Inpatient Hospital Stay

## 2024-03-13 ENCOUNTER — Inpatient Hospital Stay: Admitting: Family

## 2024-11-25 ENCOUNTER — Other Ambulatory Visit (HOSPITAL_COMMUNITY): Payer: Self-pay
